# Patient Record
Sex: Male | Born: 1940
Health system: Southern US, Community
[De-identification: ages and names within clinical notes are randomized; demographics above are authoritative.]

## PROBLEM LIST (undated history)

## (undated) DIAGNOSIS — D638 Anemia in other chronic diseases classified elsewhere: Secondary | ICD-10-CM

## (undated) DIAGNOSIS — R3911 Hesitancy of micturition: Secondary | ICD-10-CM

## (undated) DIAGNOSIS — Z125 Encounter for screening for malignant neoplasm of prostate: Secondary | ICD-10-CM

## (undated) DIAGNOSIS — R5383 Other fatigue: Secondary | ICD-10-CM

## (undated) DIAGNOSIS — I209 Angina pectoris, unspecified: Secondary | ICD-10-CM

## (undated) DIAGNOSIS — M1990 Unspecified osteoarthritis, unspecified site: Secondary | ICD-10-CM

## (undated) DIAGNOSIS — R7303 Prediabetes: Secondary | ICD-10-CM

## (undated) DIAGNOSIS — I700 Atherosclerosis of aorta: Secondary | ICD-10-CM

## (undated) DIAGNOSIS — R5381 Other malaise: Secondary | ICD-10-CM

## (undated) DIAGNOSIS — I517 Cardiomegaly: Secondary | ICD-10-CM

## (undated) DIAGNOSIS — Z79899 Other long term (current) drug therapy: Secondary | ICD-10-CM

## (undated) DIAGNOSIS — I480 Paroxysmal atrial fibrillation: Secondary | ICD-10-CM

## (undated) DIAGNOSIS — R0902 Hypoxemia: Secondary | ICD-10-CM

## (undated) DIAGNOSIS — I2510 Atherosclerotic heart disease of native coronary artery without angina pectoris: Secondary | ICD-10-CM

## (undated) DIAGNOSIS — M5136 Other intervertebral disc degeneration, lumbar region: Secondary | ICD-10-CM

## (undated) DIAGNOSIS — R600 Localized edema: Secondary | ICD-10-CM

## (undated) DIAGNOSIS — E538 Deficiency of other specified B group vitamins: Secondary | ICD-10-CM

## (undated) DIAGNOSIS — I10 Essential (primary) hypertension: Secondary | ICD-10-CM

## (undated) DIAGNOSIS — N401 Enlarged prostate with lower urinary tract symptoms: Secondary | ICD-10-CM

## (undated) DIAGNOSIS — J8410 Pulmonary fibrosis, unspecified: Secondary | ICD-10-CM

## (undated) DIAGNOSIS — G4733 Obstructive sleep apnea (adult) (pediatric): Secondary | ICD-10-CM

## (undated) DIAGNOSIS — Z6836 Body mass index (BMI) 36.0-36.9, adult: Secondary | ICD-10-CM

## (undated) DIAGNOSIS — I639 Cerebral infarction, unspecified: Secondary | ICD-10-CM

## (undated) DIAGNOSIS — I5032 Chronic diastolic (congestive) heart failure: Secondary | ICD-10-CM

## (undated) DIAGNOSIS — E785 Hyperlipidemia, unspecified: Secondary | ICD-10-CM

## (undated) HISTORY — DX: Hesitancy of micturition: R39.11

## (undated) HISTORY — DX: Atherosclerotic heart disease of native coronary artery without angina pectoris: I25.10

## (undated) HISTORY — DX: Morbid (severe) obesity due to excess calories: E66.01

## (undated) HISTORY — PX: CATARACT EXTRACTION, BILATERAL: SHX1313

## (undated) HISTORY — PX: HERNIA REPAIR: SHX51

## (undated) HISTORY — DX: Cerebral infarction, unspecified: I63.9

## (undated) HISTORY — DX: Benign prostatic hyperplasia with lower urinary tract symptoms: N40.1

## (undated) HISTORY — DX: Other intervertebral disc degeneration, lumbar region: M51.36

## (undated) HISTORY — DX: Prediabetes: R73.03

## (undated) HISTORY — DX: Angina pectoris, unspecified: I20.9

## (undated) HISTORY — PX: CORONARY ANGIOPLASTY WITH STENT PLACEMENT: SHX49

## (undated) HISTORY — DX: Body mass index (bmi) 36.0-36.9, adult: Z68.36

## (undated) HISTORY — DX: Pulmonary fibrosis, unspecified: J84.10

## (undated) HISTORY — DX: Deficiency of other specified B group vitamins: E53.8

## (undated) HISTORY — DX: Unspecified osteoarthritis, unspecified site: M19.90

## (undated) HISTORY — DX: Other malaise: R53.81

## (undated) HISTORY — DX: Cardiomegaly: I51.7

## (undated) HISTORY — DX: Obstructive sleep apnea (adult) (pediatric): G47.33

## (undated) HISTORY — DX: Anemia in other chronic diseases classified elsewhere: D63.8

## (undated) HISTORY — DX: Chronic diastolic (congestive) heart failure: I50.32

## (undated) HISTORY — DX: Essential (primary) hypertension: I10

## (undated) HISTORY — DX: Paroxysmal atrial fibrillation: I48.0

## (undated) HISTORY — DX: Other fatigue: R53.83

## (undated) HISTORY — DX: Atherosclerosis of aorta: I70.0

## (undated) HISTORY — PX: APPENDECTOMY: SHX54

## (undated) HISTORY — DX: Hypoxemia: R09.02

## (undated) HISTORY — DX: Other long term (current) drug therapy: Z79.899

## (undated) HISTORY — DX: Hyperlipidemia, unspecified: E78.5

## (undated) HISTORY — DX: Localized edema: R60.0

## (undated) HISTORY — DX: Encounter for screening for malignant neoplasm of prostate: Z12.5

---

## 2015-02-03 DIAGNOSIS — I10 Essential (primary) hypertension: Secondary | ICD-10-CM | POA: Insufficient documentation

## 2015-02-03 DIAGNOSIS — E785 Hyperlipidemia, unspecified: Secondary | ICD-10-CM

## 2015-02-03 DIAGNOSIS — I2510 Atherosclerotic heart disease of native coronary artery without angina pectoris: Secondary | ICD-10-CM

## 2015-02-03 DIAGNOSIS — I110 Hypertensive heart disease with heart failure: Secondary | ICD-10-CM

## 2015-02-03 HISTORY — DX: Hypertensive heart disease with heart failure: I11.0

## 2015-02-03 HISTORY — DX: Atherosclerotic heart disease of native coronary artery without angina pectoris: I25.10

## 2015-02-03 HISTORY — DX: Essential (primary) hypertension: I10

## 2015-02-03 HISTORY — DX: Hyperlipidemia, unspecified: E78.5

## 2015-02-04 DIAGNOSIS — R079 Chest pain, unspecified: Secondary | ICD-10-CM

## 2015-02-04 DIAGNOSIS — I209 Angina pectoris, unspecified: Secondary | ICD-10-CM | POA: Insufficient documentation

## 2015-02-04 HISTORY — DX: Angina pectoris, unspecified: I20.9

## 2015-02-04 HISTORY — DX: Chest pain, unspecified: R07.9

## 2015-03-02 DIAGNOSIS — I480 Paroxysmal atrial fibrillation: Secondary | ICD-10-CM

## 2015-03-02 HISTORY — DX: Paroxysmal atrial fibrillation: I48.0

## 2015-09-14 DIAGNOSIS — J8410 Pulmonary fibrosis, unspecified: Secondary | ICD-10-CM | POA: Diagnosis not present

## 2015-09-14 DIAGNOSIS — R7303 Prediabetes: Secondary | ICD-10-CM | POA: Diagnosis not present

## 2015-09-14 DIAGNOSIS — M5137 Other intervertebral disc degeneration, lumbosacral region: Secondary | ICD-10-CM | POA: Diagnosis not present

## 2015-09-14 DIAGNOSIS — M5416 Radiculopathy, lumbar region: Secondary | ICD-10-CM | POA: Diagnosis not present

## 2015-09-14 DIAGNOSIS — I10 Essential (primary) hypertension: Secondary | ICD-10-CM | POA: Diagnosis not present

## 2015-10-11 DIAGNOSIS — M1990 Unspecified osteoarthritis, unspecified site: Secondary | ICD-10-CM

## 2015-10-11 DIAGNOSIS — R5381 Other malaise: Secondary | ICD-10-CM

## 2015-10-11 DIAGNOSIS — I10 Essential (primary) hypertension: Secondary | ICD-10-CM

## 2015-10-11 DIAGNOSIS — R0902 Hypoxemia: Secondary | ICD-10-CM

## 2015-10-11 DIAGNOSIS — R7303 Prediabetes: Secondary | ICD-10-CM

## 2015-10-11 DIAGNOSIS — R5383 Other fatigue: Secondary | ICD-10-CM

## 2015-10-11 DIAGNOSIS — J8410 Pulmonary fibrosis, unspecified: Secondary | ICD-10-CM

## 2015-10-11 DIAGNOSIS — I517 Cardiomegaly: Secondary | ICD-10-CM

## 2015-10-11 DIAGNOSIS — I700 Atherosclerosis of aorta: Secondary | ICD-10-CM

## 2015-10-11 DIAGNOSIS — I2510 Atherosclerotic heart disease of native coronary artery without angina pectoris: Secondary | ICD-10-CM | POA: Insufficient documentation

## 2015-10-11 HISTORY — DX: Cardiomegaly: I51.7

## 2015-10-11 HISTORY — DX: Unspecified osteoarthritis, unspecified site: M19.90

## 2015-10-11 HISTORY — DX: Hypoxemia: R09.02

## 2015-10-11 HISTORY — DX: Atherosclerotic heart disease of native coronary artery without angina pectoris: I25.10

## 2015-10-11 HISTORY — DX: Essential (primary) hypertension: I10

## 2015-10-11 HISTORY — DX: Other fatigue: R53.81

## 2015-10-11 HISTORY — DX: Atherosclerosis of aorta: I70.0

## 2015-10-11 HISTORY — DX: Pulmonary fibrosis, unspecified: J84.10

## 2015-10-11 HISTORY — DX: Prediabetes: R73.03

## 2015-10-15 DIAGNOSIS — Z79899 Other long term (current) drug therapy: Secondary | ICD-10-CM | POA: Insufficient documentation

## 2015-10-15 HISTORY — DX: Other long term (current) drug therapy: Z79.899

## 2015-10-18 DIAGNOSIS — Z125 Encounter for screening for malignant neoplasm of prostate: Secondary | ICD-10-CM | POA: Diagnosis not present

## 2015-10-18 DIAGNOSIS — I25118 Atherosclerotic heart disease of native coronary artery with other forms of angina pectoris: Secondary | ICD-10-CM | POA: Diagnosis not present

## 2015-10-18 DIAGNOSIS — I517 Cardiomegaly: Secondary | ICD-10-CM | POA: Diagnosis not present

## 2015-10-18 DIAGNOSIS — M150 Primary generalized (osteo)arthritis: Secondary | ICD-10-CM | POA: Diagnosis not present

## 2015-10-18 DIAGNOSIS — R5383 Other fatigue: Secondary | ICD-10-CM | POA: Diagnosis not present

## 2015-10-18 DIAGNOSIS — E66811 Obesity, class 1: Secondary | ICD-10-CM

## 2015-10-18 DIAGNOSIS — J8410 Pulmonary fibrosis, unspecified: Secondary | ICD-10-CM | POA: Diagnosis not present

## 2015-10-18 DIAGNOSIS — R5381 Other malaise: Secondary | ICD-10-CM | POA: Diagnosis not present

## 2015-10-18 DIAGNOSIS — E782 Mixed hyperlipidemia: Secondary | ICD-10-CM | POA: Diagnosis not present

## 2015-10-18 DIAGNOSIS — I209 Angina pectoris, unspecified: Secondary | ICD-10-CM | POA: Diagnosis not present

## 2015-10-18 DIAGNOSIS — Z23 Encounter for immunization: Secondary | ICD-10-CM | POA: Diagnosis not present

## 2015-10-18 DIAGNOSIS — I700 Atherosclerosis of aorta: Secondary | ICD-10-CM | POA: Diagnosis not present

## 2015-10-18 DIAGNOSIS — Z79899 Other long term (current) drug therapy: Secondary | ICD-10-CM | POA: Diagnosis not present

## 2015-10-18 DIAGNOSIS — R69 Illness, unspecified: Secondary | ICD-10-CM

## 2015-10-18 DIAGNOSIS — Z6836 Body mass index (BMI) 36.0-36.9, adult: Secondary | ICD-10-CM

## 2015-10-18 DIAGNOSIS — Z0000 Encounter for general adult medical examination without abnormal findings: Secondary | ICD-10-CM | POA: Diagnosis not present

## 2015-10-18 DIAGNOSIS — E66812 Obesity, class 2: Secondary | ICD-10-CM

## 2015-10-18 DIAGNOSIS — I10 Essential (primary) hypertension: Secondary | ICD-10-CM | POA: Diagnosis not present

## 2015-10-18 DIAGNOSIS — R7303 Prediabetes: Secondary | ICD-10-CM | POA: Diagnosis not present

## 2015-10-18 DIAGNOSIS — E669 Obesity, unspecified: Secondary | ICD-10-CM

## 2015-10-18 HISTORY — DX: Obesity, class 1: E66.811

## 2015-10-18 HISTORY — DX: Morbid (severe) obesity due to excess calories: E66.01

## 2015-10-18 HISTORY — DX: Illness, unspecified: R69

## 2015-10-18 HISTORY — DX: Obesity, unspecified: E66.9

## 2015-10-18 HISTORY — DX: Obesity, class 2: E66.812

## 2015-12-29 DIAGNOSIS — I2510 Atherosclerotic heart disease of native coronary artery without angina pectoris: Secondary | ICD-10-CM | POA: Diagnosis not present

## 2015-12-29 DIAGNOSIS — E785 Hyperlipidemia, unspecified: Secondary | ICD-10-CM | POA: Diagnosis not present

## 2015-12-29 DIAGNOSIS — I10 Essential (primary) hypertension: Secondary | ICD-10-CM | POA: Diagnosis not present

## 2015-12-29 DIAGNOSIS — I480 Paroxysmal atrial fibrillation: Secondary | ICD-10-CM | POA: Diagnosis not present

## 2016-02-16 DIAGNOSIS — E782 Mixed hyperlipidemia: Secondary | ICD-10-CM | POA: Diagnosis not present

## 2016-02-16 DIAGNOSIS — I10 Essential (primary) hypertension: Secondary | ICD-10-CM | POA: Diagnosis not present

## 2016-02-16 DIAGNOSIS — E876 Hypokalemia: Secondary | ICD-10-CM | POA: Diagnosis not present

## 2016-02-16 DIAGNOSIS — R7303 Prediabetes: Secondary | ICD-10-CM | POA: Diagnosis not present

## 2016-02-16 DIAGNOSIS — M150 Primary generalized (osteo)arthritis: Secondary | ICD-10-CM | POA: Diagnosis not present

## 2016-02-16 DIAGNOSIS — J8410 Pulmonary fibrosis, unspecified: Secondary | ICD-10-CM | POA: Diagnosis not present

## 2016-02-16 DIAGNOSIS — I700 Atherosclerosis of aorta: Secondary | ICD-10-CM | POA: Diagnosis not present

## 2016-02-25 DIAGNOSIS — E876 Hypokalemia: Secondary | ICD-10-CM | POA: Diagnosis not present

## 2016-03-27 DIAGNOSIS — I25118 Atherosclerotic heart disease of native coronary artery with other forms of angina pectoris: Secondary | ICD-10-CM | POA: Diagnosis not present

## 2016-03-27 DIAGNOSIS — Z01818 Encounter for other preprocedural examination: Secondary | ICD-10-CM | POA: Diagnosis not present

## 2016-03-27 DIAGNOSIS — J8410 Pulmonary fibrosis, unspecified: Secondary | ICD-10-CM | POA: Diagnosis not present

## 2016-03-27 DIAGNOSIS — R7303 Prediabetes: Secondary | ICD-10-CM | POA: Diagnosis not present

## 2016-03-27 DIAGNOSIS — I10 Essential (primary) hypertension: Secondary | ICD-10-CM | POA: Diagnosis not present

## 2016-03-27 DIAGNOSIS — E782 Mixed hyperlipidemia: Secondary | ICD-10-CM | POA: Diagnosis not present

## 2016-04-05 DIAGNOSIS — G4733 Obstructive sleep apnea (adult) (pediatric): Secondary | ICD-10-CM | POA: Diagnosis not present

## 2016-04-05 DIAGNOSIS — F411 Generalized anxiety disorder: Secondary | ICD-10-CM | POA: Diagnosis not present

## 2016-04-05 DIAGNOSIS — R5383 Other fatigue: Secondary | ICD-10-CM | POA: Diagnosis not present

## 2016-04-05 DIAGNOSIS — J4540 Moderate persistent asthma, uncomplicated: Secondary | ICD-10-CM | POA: Diagnosis not present

## 2016-04-05 DIAGNOSIS — J301 Allergic rhinitis due to pollen: Secondary | ICD-10-CM | POA: Diagnosis not present

## 2016-04-06 DIAGNOSIS — I480 Paroxysmal atrial fibrillation: Secondary | ICD-10-CM | POA: Diagnosis not present

## 2016-04-06 DIAGNOSIS — Z01810 Encounter for preprocedural cardiovascular examination: Secondary | ICD-10-CM | POA: Diagnosis not present

## 2016-04-17 DIAGNOSIS — M9973 Connective tissue and disc stenosis of intervertebral foramina of lumbar region: Secondary | ICD-10-CM | POA: Diagnosis not present

## 2016-04-21 DIAGNOSIS — I252 Old myocardial infarction: Secondary | ICD-10-CM | POA: Diagnosis not present

## 2016-04-21 DIAGNOSIS — I209 Angina pectoris, unspecified: Secondary | ICD-10-CM | POA: Diagnosis not present

## 2016-04-21 DIAGNOSIS — J45909 Unspecified asthma, uncomplicated: Secondary | ICD-10-CM | POA: Diagnosis not present

## 2016-04-21 DIAGNOSIS — I10 Essential (primary) hypertension: Secondary | ICD-10-CM | POA: Diagnosis not present

## 2016-04-21 DIAGNOSIS — Z4789 Encounter for other orthopedic aftercare: Secondary | ICD-10-CM | POA: Diagnosis not present

## 2016-04-21 DIAGNOSIS — G4730 Sleep apnea, unspecified: Secondary | ICD-10-CM | POA: Diagnosis not present

## 2016-04-21 DIAGNOSIS — I509 Heart failure, unspecified: Secondary | ICD-10-CM | POA: Diagnosis not present

## 2016-04-21 DIAGNOSIS — I25119 Atherosclerotic heart disease of native coronary artery with unspecified angina pectoris: Secondary | ICD-10-CM | POA: Diagnosis not present

## 2016-04-21 DIAGNOSIS — K219 Gastro-esophageal reflux disease without esophagitis: Secondary | ICD-10-CM | POA: Diagnosis not present

## 2016-04-21 DIAGNOSIS — K5900 Constipation, unspecified: Secondary | ICD-10-CM | POA: Diagnosis not present

## 2016-04-21 DIAGNOSIS — M1990 Unspecified osteoarthritis, unspecified site: Secondary | ICD-10-CM | POA: Diagnosis not present

## 2016-04-21 DIAGNOSIS — E7800 Pure hypercholesterolemia, unspecified: Secondary | ICD-10-CM | POA: Diagnosis not present

## 2016-05-19 DIAGNOSIS — I10 Essential (primary) hypertension: Secondary | ICD-10-CM | POA: Diagnosis not present

## 2016-05-19 DIAGNOSIS — N401 Enlarged prostate with lower urinary tract symptoms: Secondary | ICD-10-CM | POA: Diagnosis not present

## 2016-05-19 DIAGNOSIS — Z125 Encounter for screening for malignant neoplasm of prostate: Secondary | ICD-10-CM | POA: Diagnosis not present

## 2016-06-08 DIAGNOSIS — E669 Obesity, unspecified: Secondary | ICD-10-CM | POA: Diagnosis not present

## 2016-06-08 DIAGNOSIS — F418 Other specified anxiety disorders: Secondary | ICD-10-CM

## 2016-06-08 DIAGNOSIS — E785 Hyperlipidemia, unspecified: Secondary | ICD-10-CM | POA: Diagnosis not present

## 2016-06-08 DIAGNOSIS — I10 Essential (primary) hypertension: Secondary | ICD-10-CM | POA: Diagnosis not present

## 2016-06-08 DIAGNOSIS — G4733 Obstructive sleep apnea (adult) (pediatric): Secondary | ICD-10-CM

## 2016-06-08 DIAGNOSIS — I2510 Atherosclerotic heart disease of native coronary artery without angina pectoris: Secondary | ICD-10-CM | POA: Diagnosis not present

## 2016-06-08 DIAGNOSIS — N400 Enlarged prostate without lower urinary tract symptoms: Secondary | ICD-10-CM

## 2016-06-09 DIAGNOSIS — E785 Hyperlipidemia, unspecified: Secondary | ICD-10-CM | POA: Diagnosis not present

## 2016-06-09 DIAGNOSIS — F418 Other specified anxiety disorders: Secondary | ICD-10-CM | POA: Diagnosis not present

## 2016-06-09 DIAGNOSIS — N400 Enlarged prostate without lower urinary tract symptoms: Secondary | ICD-10-CM | POA: Diagnosis not present

## 2016-06-09 DIAGNOSIS — G4733 Obstructive sleep apnea (adult) (pediatric): Secondary | ICD-10-CM | POA: Diagnosis not present

## 2016-06-10 DIAGNOSIS — N400 Enlarged prostate without lower urinary tract symptoms: Secondary | ICD-10-CM

## 2016-06-10 DIAGNOSIS — G4733 Obstructive sleep apnea (adult) (pediatric): Secondary | ICD-10-CM | POA: Diagnosis not present

## 2016-06-10 DIAGNOSIS — F418 Other specified anxiety disorders: Secondary | ICD-10-CM | POA: Diagnosis not present

## 2016-06-10 DIAGNOSIS — E669 Obesity, unspecified: Secondary | ICD-10-CM | POA: Diagnosis not present

## 2016-06-10 DIAGNOSIS — E785 Hyperlipidemia, unspecified: Secondary | ICD-10-CM | POA: Diagnosis not present

## 2016-06-10 DIAGNOSIS — T814XXA Infection following a procedure, initial encounter: Secondary | ICD-10-CM | POA: Diagnosis not present

## 2016-06-11 DIAGNOSIS — N400 Enlarged prostate without lower urinary tract symptoms: Secondary | ICD-10-CM | POA: Diagnosis not present

## 2016-06-11 DIAGNOSIS — F418 Other specified anxiety disorders: Secondary | ICD-10-CM | POA: Diagnosis not present

## 2016-06-11 DIAGNOSIS — T814XXA Infection following a procedure, initial encounter: Secondary | ICD-10-CM | POA: Diagnosis not present

## 2016-06-11 DIAGNOSIS — E669 Obesity, unspecified: Secondary | ICD-10-CM | POA: Diagnosis not present

## 2016-06-12 DIAGNOSIS — E669 Obesity, unspecified: Secondary | ICD-10-CM | POA: Diagnosis not present

## 2016-06-12 DIAGNOSIS — N400 Enlarged prostate without lower urinary tract symptoms: Secondary | ICD-10-CM | POA: Diagnosis not present

## 2016-06-12 DIAGNOSIS — F418 Other specified anxiety disorders: Secondary | ICD-10-CM | POA: Diagnosis not present

## 2016-06-12 DIAGNOSIS — T814XXA Infection following a procedure, initial encounter: Secondary | ICD-10-CM | POA: Diagnosis not present

## 2016-06-13 DIAGNOSIS — F418 Other specified anxiety disorders: Secondary | ICD-10-CM | POA: Diagnosis not present

## 2016-06-13 DIAGNOSIS — N400 Enlarged prostate without lower urinary tract symptoms: Secondary | ICD-10-CM | POA: Diagnosis not present

## 2016-06-13 DIAGNOSIS — E785 Hyperlipidemia, unspecified: Secondary | ICD-10-CM | POA: Diagnosis not present

## 2016-06-13 DIAGNOSIS — G4733 Obstructive sleep apnea (adult) (pediatric): Secondary | ICD-10-CM | POA: Diagnosis not present

## 2016-06-14 DIAGNOSIS — F418 Other specified anxiety disorders: Secondary | ICD-10-CM | POA: Diagnosis not present

## 2016-06-14 DIAGNOSIS — G4733 Obstructive sleep apnea (adult) (pediatric): Secondary | ICD-10-CM | POA: Diagnosis not present

## 2016-06-14 DIAGNOSIS — E785 Hyperlipidemia, unspecified: Secondary | ICD-10-CM | POA: Diagnosis not present

## 2016-06-14 DIAGNOSIS — N400 Enlarged prostate without lower urinary tract symptoms: Secondary | ICD-10-CM | POA: Diagnosis not present

## 2016-08-17 DIAGNOSIS — R5383 Other fatigue: Secondary | ICD-10-CM | POA: Diagnosis not present

## 2016-08-17 DIAGNOSIS — E782 Mixed hyperlipidemia: Secondary | ICD-10-CM | POA: Diagnosis not present

## 2016-08-17 DIAGNOSIS — I25118 Atherosclerotic heart disease of native coronary artery with other forms of angina pectoris: Secondary | ICD-10-CM | POA: Diagnosis not present

## 2016-08-17 DIAGNOSIS — N401 Enlarged prostate with lower urinary tract symptoms: Secondary | ICD-10-CM

## 2016-08-17 DIAGNOSIS — R300 Dysuria: Secondary | ICD-10-CM | POA: Diagnosis not present

## 2016-08-17 DIAGNOSIS — M5136 Other intervertebral disc degeneration, lumbar region: Secondary | ICD-10-CM

## 2016-08-17 DIAGNOSIS — N400 Enlarged prostate without lower urinary tract symptoms: Secondary | ICD-10-CM

## 2016-08-17 DIAGNOSIS — M51369 Other intervertebral disc degeneration, lumbar region without mention of lumbar back pain or lower extremity pain: Secondary | ICD-10-CM

## 2016-08-17 DIAGNOSIS — E669 Obesity, unspecified: Secondary | ICD-10-CM | POA: Diagnosis not present

## 2016-08-17 DIAGNOSIS — Z79899 Other long term (current) drug therapy: Secondary | ICD-10-CM | POA: Diagnosis not present

## 2016-08-17 DIAGNOSIS — R7303 Prediabetes: Secondary | ICD-10-CM | POA: Diagnosis not present

## 2016-08-17 DIAGNOSIS — R3911 Hesitancy of micturition: Secondary | ICD-10-CM | POA: Diagnosis not present

## 2016-08-17 DIAGNOSIS — I10 Essential (primary) hypertension: Secondary | ICD-10-CM | POA: Diagnosis not present

## 2016-08-17 DIAGNOSIS — R419 Unspecified symptoms and signs involving cognitive functions and awareness: Secondary | ICD-10-CM | POA: Diagnosis not present

## 2016-08-17 DIAGNOSIS — R5381 Other malaise: Secondary | ICD-10-CM | POA: Diagnosis not present

## 2016-08-17 HISTORY — DX: Benign prostatic hyperplasia with lower urinary tract symptoms: N40.1

## 2016-08-17 HISTORY — DX: Other intervertebral disc degeneration, lumbar region: M51.36

## 2016-08-17 HISTORY — DX: Benign prostatic hyperplasia without lower urinary tract symptoms: N40.0

## 2016-08-17 HISTORY — DX: Other intervertebral disc degeneration, lumbar region without mention of lumbar back pain or lower extremity pain: M51.369

## 2016-10-03 DIAGNOSIS — D649 Anemia, unspecified: Secondary | ICD-10-CM | POA: Diagnosis not present

## 2016-10-03 DIAGNOSIS — M5136 Other intervertebral disc degeneration, lumbar region: Secondary | ICD-10-CM | POA: Diagnosis not present

## 2016-10-03 DIAGNOSIS — Z79899 Other long term (current) drug therapy: Secondary | ICD-10-CM | POA: Diagnosis not present

## 2016-10-03 DIAGNOSIS — D638 Anemia in other chronic diseases classified elsewhere: Secondary | ICD-10-CM

## 2016-10-03 DIAGNOSIS — I25118 Atherosclerotic heart disease of native coronary artery with other forms of angina pectoris: Secondary | ICD-10-CM | POA: Diagnosis not present

## 2016-10-03 DIAGNOSIS — I10 Essential (primary) hypertension: Secondary | ICD-10-CM | POA: Diagnosis not present

## 2016-10-03 DIAGNOSIS — Z712 Person consulting for explanation of examination or test findings: Secondary | ICD-10-CM | POA: Diagnosis not present

## 2016-10-03 DIAGNOSIS — E538 Deficiency of other specified B group vitamins: Secondary | ICD-10-CM

## 2016-10-03 HISTORY — DX: Anemia in other chronic diseases classified elsewhere: D63.8

## 2016-10-03 HISTORY — DX: Deficiency of other specified B group vitamins: E53.8

## 2016-11-14 DIAGNOSIS — Z79899 Other long term (current) drug therapy: Secondary | ICD-10-CM | POA: Diagnosis not present

## 2016-11-14 DIAGNOSIS — E538 Deficiency of other specified B group vitamins: Secondary | ICD-10-CM | POA: Diagnosis not present

## 2016-11-14 DIAGNOSIS — R600 Localized edema: Secondary | ICD-10-CM

## 2016-11-14 DIAGNOSIS — D649 Anemia, unspecified: Secondary | ICD-10-CM | POA: Diagnosis not present

## 2016-11-14 DIAGNOSIS — I25118 Atherosclerotic heart disease of native coronary artery with other forms of angina pectoris: Secondary | ICD-10-CM | POA: Diagnosis not present

## 2016-11-14 HISTORY — DX: Localized edema: R60.0

## 2017-07-10 DIAGNOSIS — K449 Diaphragmatic hernia without obstruction or gangrene: Secondary | ICD-10-CM | POA: Diagnosis not present

## 2017-07-10 DIAGNOSIS — R5381 Other malaise: Secondary | ICD-10-CM | POA: Diagnosis not present

## 2017-07-10 DIAGNOSIS — R1032 Left lower quadrant pain: Secondary | ICD-10-CM | POA: Diagnosis not present

## 2017-07-10 DIAGNOSIS — I10 Essential (primary) hypertension: Secondary | ICD-10-CM | POA: Diagnosis not present

## 2017-07-10 DIAGNOSIS — R7303 Prediabetes: Secondary | ICD-10-CM | POA: Diagnosis not present

## 2017-10-05 DIAGNOSIS — I10 Essential (primary) hypertension: Secondary | ICD-10-CM | POA: Diagnosis not present

## 2017-10-05 DIAGNOSIS — D649 Anemia, unspecified: Secondary | ICD-10-CM | POA: Diagnosis not present

## 2017-10-05 DIAGNOSIS — E538 Deficiency of other specified B group vitamins: Secondary | ICD-10-CM | POA: Diagnosis not present

## 2017-10-05 DIAGNOSIS — I25118 Atherosclerotic heart disease of native coronary artery with other forms of angina pectoris: Secondary | ICD-10-CM | POA: Diagnosis not present

## 2017-10-05 DIAGNOSIS — R7303 Prediabetes: Secondary | ICD-10-CM | POA: Diagnosis not present

## 2017-10-05 DIAGNOSIS — E782 Mixed hyperlipidemia: Secondary | ICD-10-CM | POA: Diagnosis not present

## 2017-10-05 DIAGNOSIS — J8410 Pulmonary fibrosis, unspecified: Secondary | ICD-10-CM | POA: Diagnosis not present

## 2018-04-10 DIAGNOSIS — I25118 Atherosclerotic heart disease of native coronary artery with other forms of angina pectoris: Secondary | ICD-10-CM | POA: Diagnosis not present

## 2018-04-10 DIAGNOSIS — M150 Primary generalized (osteo)arthritis: Secondary | ICD-10-CM | POA: Diagnosis not present

## 2018-04-10 DIAGNOSIS — R5383 Other fatigue: Secondary | ICD-10-CM | POA: Diagnosis not present

## 2018-04-10 DIAGNOSIS — Z125 Encounter for screening for malignant neoplasm of prostate: Secondary | ICD-10-CM | POA: Insufficient documentation

## 2018-04-10 DIAGNOSIS — E538 Deficiency of other specified B group vitamins: Secondary | ICD-10-CM | POA: Diagnosis not present

## 2018-04-10 DIAGNOSIS — D638 Anemia in other chronic diseases classified elsewhere: Secondary | ICD-10-CM | POA: Diagnosis not present

## 2018-04-10 DIAGNOSIS — G4733 Obstructive sleep apnea (adult) (pediatric): Secondary | ICD-10-CM

## 2018-04-10 DIAGNOSIS — E782 Mixed hyperlipidemia: Secondary | ICD-10-CM | POA: Diagnosis not present

## 2018-04-10 DIAGNOSIS — R5381 Other malaise: Secondary | ICD-10-CM | POA: Diagnosis not present

## 2018-04-10 DIAGNOSIS — N401 Enlarged prostate with lower urinary tract symptoms: Secondary | ICD-10-CM | POA: Diagnosis not present

## 2018-04-10 DIAGNOSIS — Z0000 Encounter for general adult medical examination without abnormal findings: Secondary | ICD-10-CM | POA: Diagnosis not present

## 2018-04-10 DIAGNOSIS — I700 Atherosclerosis of aorta: Secondary | ICD-10-CM | POA: Diagnosis not present

## 2018-04-10 DIAGNOSIS — R7303 Prediabetes: Secondary | ICD-10-CM | POA: Diagnosis not present

## 2018-04-10 DIAGNOSIS — R3911 Hesitancy of micturition: Secondary | ICD-10-CM | POA: Diagnosis not present

## 2018-04-10 DIAGNOSIS — I10 Essential (primary) hypertension: Secondary | ICD-10-CM | POA: Diagnosis not present

## 2018-04-10 DIAGNOSIS — J8410 Pulmonary fibrosis, unspecified: Secondary | ICD-10-CM | POA: Diagnosis not present

## 2018-04-10 DIAGNOSIS — M5136 Other intervertebral disc degeneration, lumbar region: Secondary | ICD-10-CM | POA: Diagnosis not present

## 2018-04-10 HISTORY — DX: Encounter for screening for malignant neoplasm of prostate: Z12.5

## 2018-04-10 HISTORY — DX: Obstructive sleep apnea (adult) (pediatric): G47.33

## 2018-04-16 DIAGNOSIS — I5032 Chronic diastolic (congestive) heart failure: Secondary | ICD-10-CM

## 2018-04-16 HISTORY — DX: Chronic diastolic (congestive) heart failure: I50.32

## 2018-04-17 ENCOUNTER — Encounter: Payer: Self-pay | Admitting: Cardiology

## 2018-04-17 ENCOUNTER — Ambulatory Visit (INDEPENDENT_AMBULATORY_CARE_PROVIDER_SITE_OTHER): Payer: PPO | Admitting: Cardiology

## 2018-04-17 VITALS — BP 138/68 | HR 51 | Ht 70.0 in | Wt 253.0 lb

## 2018-04-17 DIAGNOSIS — I10 Essential (primary) hypertension: Secondary | ICD-10-CM | POA: Diagnosis not present

## 2018-04-17 DIAGNOSIS — I480 Paroxysmal atrial fibrillation: Secondary | ICD-10-CM

## 2018-04-17 DIAGNOSIS — I5032 Chronic diastolic (congestive) heart failure: Secondary | ICD-10-CM

## 2018-04-17 DIAGNOSIS — E785 Hyperlipidemia, unspecified: Secondary | ICD-10-CM

## 2018-04-17 DIAGNOSIS — I25118 Atherosclerotic heart disease of native coronary artery with other forms of angina pectoris: Secondary | ICD-10-CM | POA: Diagnosis not present

## 2018-04-17 MED ORDER — FUROSEMIDE 40 MG PO TABS: 40.0000 mg | ORAL_TABLET | Freq: Two times a day (BID) | ORAL | 3 refills | Status: DC

## 2018-04-17 NOTE — Progress Notes (Signed)
Cardiology Office Note:    Date:  04/17/2018   ID:  Jimmy Chapman, DOB 1941/05/08, MRN [id number]  PCP:  Gordan Payment., MD  Cardiologist:  Norman Herrlich, MD    Referring MD: Gordan Payment., MD    ASSESSMENT:    1. Chronic diastolic heart failure (HCC)   2. Coronary artery disease of native artery of native heart with stable angina pectoris (HCC)   3. Benign essential HTN   4. Hyperlipidemia, unspecified hyperlipidemia type   5. Paroxysmal atrial fibrillation (HCC)    PLAN:    In order of problems listed above:  1. His heart failure is decompensated New York Heart Association class II to class III and is volume overload with marked peripheral edema he will increase his loop diuretic 50% weigh daily and asked him to contact me if he does not have a 10 pound weight loss in the next weeks.  He will fully sodium restrict and start to weigh daily. 2. Stable continue current medical treatment I would not advise an ischemia evaluation at this time 3. Stable blood pressure target continue current treatment including calcium channel blocker ACE inhibitor and loop diuretic 4. Stable continue with statin his lipids are at target 5. Stable he said no recurrence at this time I would not anticoagulate.  He is on a beta-blocker will continue the same.   Next appointment: 6 to 8 weeks   Medication Adjustments/Labs and Tests Ordered: Current medicines are reviewed at length with the patient today.  Concerns regarding medicines are outlined above.  Orders Placed This Encounter  Procedures  . Basic metabolic panel  . Pro b natriuretic peptide (BNP)  . EKG 12-Lead   Meds ordered this encounter  Medications  . furosemide (LASIX) 40 MG tablet    Sig: Take 1 tablet (40 mg total) by mouth 2 (two) times daily. Take 2 =80 mg in the AM and 1 =40 mg in the afternoon    Dispense:  90 tablet    Refill:  3    Chief Complaint  Patient presents with  . Follow-up    History of Present Illness:     Jimmy Chapman is a 77 y.o. male with a hx of CAD with multivessel PCI November 2014 proximal LAD left circumflex and right coronary artery EF 45%.  Subsequently 02/03/2015 PCI and drug-eluting stent to left circumflex and balloon angioplasty ostial right coronary artery for unstable angina EF 60%.  Other problems have included diastolic heart failure brief isolated paroxysmal atrial fibrillation during PCI  hypertension and hyperlipidemia last seen 12/28/16. Compliance with diet, lifestyle and medications: Not fully he is eating meals outside of the home and does not weigh daily although he does restrict sodium in addition to his diet.  He is noticed increasing shortness of breath from his baseline as well as edema and unspecified weight gain.  He is a CPAP and has had no orthopnea no angina palpitations syncope or TIA Past Medical History:  Diagnosis Date  . Anemia, chronic disease 10/03/2016  . Angina pectoris (HCC) 02/04/2015  . Aortic calcification (HCC) 10/11/2015  . Benign essential HTN 02/03/2015  . Benign prostatic hyperplasia with urinary hesitancy 08/17/2016  . Chronic diastolic heart failure (HCC) 04/16/2018  . Class 2 severe obesity due to excess calories with serious comorbidity and body mass index (BMI) of 36.0 to 36.9 in adult (HCC) 10/18/2015  . Coronary disease 10/11/2015   1. Multivessel PCI in Nov 2014 with 3 Xience stents to proximal LAD,  LCF and RCA, EF 45% 2. 02/03/15 with PCI and resolute stennt LCF and PTCA of ostial RCA stenosis, EF 60% for unstable angina  . Degenerative lumbar disc 08/17/2016  . High risk medication use 10/15/2015  . Hyperlipidemia 02/03/2015  . Hypoxia 10/11/2015  . LAE (left atrial enlargement) 10/11/2015  . Left ventricular hypertrophy 10/11/2015  . Localized edema 11/14/2016  . Malaise and fatigue 10/11/2015  . Obstructive sleep apnea syndrome 04/10/2018   Wears CPAP  . Osteoarthritis 10/11/2015  . Paroxysmal atrial fibrillation (HCC) 03/02/2015   Brief during  cardiac cath, not anticoagulated  . Prediabetes 10/11/2015  . Pulmonary fibrosis (HCC) 10/11/2015  . Screening for prostate cancer 04/10/2018   Chooses no further testing.  . Vitamin B12 deficiency 10/03/2016    Past Surgical History:  Procedure Laterality Date  . APPENDECTOMY    . CORONARY ANGIOPLASTY WITH STENT PLACEMENT    . HERNIA REPAIR      Current Medications: Current Meds  Medication Sig  . albuterol (PROVENTIL HFA) 108 (90 Base) MCG/ACT inhaler Inhale 2 puffs into the lungs every 6 (six) hours as needed for wheezing or shortness of breath.   . ALPRAZolam (XANAX) 0.5 MG tablet Take 0.5 mg by mouth at bedtime as needed for anxiety.  Marland Kitchen amLODipine (NORVASC) 10 MG tablet Take 5 mg by mouth daily.  . Ascorbic Acid (VITAMIN C) 1000 MG tablet Take 1,000 mg by mouth 2 (two) times daily.  Marland Kitchen aspirin EC 81 MG tablet Take 81 mg by mouth daily.  Marland Kitchen atorvastatin (LIPITOR) 40 MG tablet Take 40 mg by mouth daily.  . benazepril (LOTENSIN) 40 MG tablet Take 40 mg by mouth daily.  . Carboxymethylcellulose Sod PF (REFRESH PLUS) 0.5 % SOLN Administer 2 drops to both eyes daily at 0600.  Marland Kitchen Cholecalciferol (VITAMIN D PO) Take 1 capsule by mouth daily.  . clopidogrel (PLAVIX) 75 MG tablet Take 75 mg by mouth daily.  . finasteride (PROSCAR) 5 MG tablet Take 5 mg by mouth daily.  . fluticasone (FLONASE) 50 MCG/ACT nasal spray 2 sprays by Each Nare route Two (2) times a day.  . furosemide (LASIX) 40 MG tablet Take 1 tablet (40 mg total) by mouth 2 (two) times daily. Take 2 =80 mg in the AM and 1 =40 mg in the afternoon  . gabapentin (NEURONTIN) 300 MG capsule Take 300 mg by mouth every other day.  . metoprolol tartrate (LOPRESSOR) 25 MG tablet Take 25 mg by mouth 2 (two) times daily.  . Mometasone Furoate (ASMANEX HFA IN) Inhale 2 puffs into the lungs 2 (two) times daily.  . nitroGLYCERIN (NITROSTAT) 0.4 MG SL tablet Place 0.4 mg under the tongue every 5 (five) minutes as needed for chest pain.  .  pantoprazole (PROTONIX) 40 MG tablet Take 40 mg by mouth daily.  . Potassium Chloride ER 20 MEQ TBCR Take 1 tablet by mouth 2 (two) times daily.  . Probiotic Product (PROBIOTIC DAILY PO) Take 1 capsule by mouth daily.  . tamsulosin (FLOMAX) 0.4 MG CAPS capsule Take 0.4 mg by mouth daily.  . [DISCONTINUED] furosemide (LASIX) 40 MG tablet Take 40 mg by mouth 2 (two) times daily.     Allergies:   Patient has no known allergies.   Social History   Socioeconomic History  . Marital status: Married    Spouse name: Not on file  . Number of children: Not on file  . Years of education: Not on file  . Highest education level: Not on file  Occupational History  . Not on file  Social Needs  . Financial resource strain: Not on file  . Food insecurity:    Worry: Not on file    Inability: Not on file  . Transportation needs:    Medical: Not on file    Non-medical: Not on file  Tobacco Use  . Smoking status: Former Games developermoker  . Smokeless tobacco: Former Engineer, waterUser  Substance and Sexual Activity  . Alcohol use: Yes  . Drug use: Not Currently  . Sexual activity: Not on file  Lifestyle  . Physical activity:    Days per week: Not on file    Minutes per session: Not on file  . Stress: Not on file  Relationships  . Social connections:    Talks on phone: Not on file    Gets together: Not on file    Attends religious service: Not on file    Active member of club or organization: Not on file    Attends meetings of clubs or organizations: Not on file    Relationship status: Not on file  Other Topics Concern  . Not on file  Social History Narrative  . Not on file     Family History: The patient's family history includes Heart attack in his father; Hypertension in his mother. ROS:   Please see the history of present illness.    All other systems reviewed and are negative.  EKGs/Labs/Other Studies Reviewed:    The following studies were reviewed today:  EKG:  EKG ordered today.  The ekg  ordered today demonstrates sinus bradycardia 51 bpm old anterior septal MI  Recent Labs: No results found for requested labs within last 8760 hours.  Recent Lipid Panel No results found for: CHOL, TRIG, HDL, CHOLHDL, VLDL, LDLCALC, LDLDIRECT  Physical Exam:    VS:  BP 138/68 (BP Location: Right Arm, Patient Position: Sitting, Cuff Size: Large)   Pulse (!) 51   Ht 5\' 10"  (1.778 m)   Wt 253 lb (114.8 kg)   SpO2 96%   BMI 36.30 kg/m     Wt Readings from Last 3 Encounters:  04/17/18 253 lb (114.8 kg)     GEN:  Well nourished, well developed in no acute distress HEENT: Normal NECK: No JVD; No carotid bruits LYMPHATICS: No lymphadenopathy CARDIAC: 3+ to the knee bilateral RRR, no murmurs, rubs, gallops RESPIRATORY:  Clear to auscultation without rales, wheezing or rhonchi  ABDOMEN: Soft, non-tender, non-distended MUSCULOSKELETAL:  No edema; No deformity  SKIN: Warm and dry NEUROLOGIC:  Alert and oriented x 3 PSYCHIATRIC:  Normal affect    Signed, Norman HerrlichBrian Lucile Didonato, MD  04/17/2018 4:58 PM    [facility]

## 2018-04-17 NOTE — Patient Instructions (Signed)
Medication Instructions:  Your physician has recommended you make the following change in your medication:   INCREASE: Furosemide to 80 mg in the morning and 40 mg in the evening daily.   Labwork: Your physician recommends that you return for lab work in 2 weeks: BMP and Pro BNP   Testing/Procedures: None.  Follow-Up: Your physician recommends that you schedule a follow-up appointment in: 2 months.    Any Other Special Instructions Will Be Listed Below (If Applicable).     If you need a refill on your cardiac medications before your next appointment, please call your pharmacy.

## 2018-05-02 ENCOUNTER — Telehealth: Payer: Self-pay | Admitting: Emergency Medicine

## 2018-05-02 NOTE — Telephone Encounter (Signed)
Attempted to contact patient regarding labs that need to be drawn today. Unable to leave a voicemail and no answer for other's on dpr. Will continue efforts.

## 2018-05-07 DIAGNOSIS — I5032 Chronic diastolic (congestive) heart failure: Secondary | ICD-10-CM | POA: Diagnosis not present

## 2018-05-08 ENCOUNTER — Telehealth: Payer: Self-pay

## 2018-05-08 LAB — BASIC METABOLIC PANEL
BUN / CREAT RATIO: 16 (ref 10–24)
BUN: 16 mg/dL (ref 8–27)
CO2: 26 mmol/L (ref 20–29)
CREATININE: 0.98 mg/dL (ref 0.76–1.27)
Calcium: 9.2 mg/dL (ref 8.6–10.2)
Chloride: 100 mmol/L (ref 96–106)
GFR calc Af Amer: 87 mL/min/{1.73_m2} (ref >59)
GFR calc non Af Amer: 75 mL/min/{1.73_m2} (ref >59)
GLUCOSE: 95 mg/dL (ref 65–99)
Potassium: 3.7 mmol/L (ref 3.5–5.2)
Sodium: 144 mmol/L (ref 134–144)

## 2018-05-08 LAB — PRO B NATRIURETIC PEPTIDE: NT-PRO BNP: 46 pg/mL (ref 0–486)

## 2018-05-08 NOTE — Telephone Encounter (Signed)
Attempted to reach patient regarding lab results, but there was no answer and voicemail has not been set up.  Will continue efforts.

## 2018-05-09 NOTE — Telephone Encounter (Signed)
Hayley informed patient of results.

## 2018-07-01 NOTE — Progress Notes (Signed)
Cardiology Office Note:    Date:  07/02/2018   ID:  Jimmy Chapman, DOB Jan 07, 1941, MRN [id number]  PCP:  Gordan Payment., MD  Cardiologist:  Norman Herrlich, MD    Referring MD: Gordan Payment., MD    ASSESSMENT:    1. Coronary artery disease of native artery of native heart with stable angina pectoris (HCC)   2. Chronic diastolic heart failure (HCC)   3. Benign essential HTN   4. Paroxysmal atrial fibrillation (HCC)   5. Hyperlipidemia, unspecified hyperlipidemia type    PLAN:    In order of problems listed above:  1. Stable CAD has not had no anginal discomfort New York Heart Association class I and will continue current treatment with clopidogrel beta-blocker is high intensity statin 2. Improved he has no fluid overload near Heart Association class I continue his current higher dose loop diuretic and asked him to minimize his use of gabapentin which causes intense sodium retention 3. Stable blood pressure target continue current treatment eluding beta-blocker diuretic ACE inhibitor his knees had recent labs in the [facility] and asked him to bring them to my office to assess renal function potassium 4. Stable no recurrence continue beta-blocker clopidogrel I would not commit anticoagulation 5. Stable continue high intensity statin with CAD and hopefully bring labs to my office to see if he achieves target and liver function is normal   Next appointment: 6 months   Medication Adjustments/Labs and Tests Ordered: Current medicines are reviewed at length with the patient today.  Concerns regarding medicines are outlined above.  Orders Placed This Encounter  Procedures  . EKG 12-Lead   No orders of the defined types were placed in this encounter.   Chief Complaint  Patient presents with  . Follow-up  . Coronary Artery Disease  . Congestive Heart Failure  . Atrial Fibrillation  . Hypertension  . Hyperlipidemia    History of Present Illness:     Jimmy Chapman is a 77  y.o. male with a hx of CAD with multivessel PCI November 2014 proximal LAD left circumflex and right coronary artery EF 45%.  Subsequently 02/03/2015 PCI and drug-eluting stent to left circumflex and balloon angioplasty ostial right coronary artery for unstable angina EF 60%.  Other problems have included diastolic heart failure brief isolated paroxysmal atrial fibrillation during PCI  hypertension and hyperlipidemia  last seen 04/27/18. Compliance with diet, lifestyle and medications: Yes  He is doing better since increasing his diuretic and he has been reducing his gabapentin because of sodium retention navigation supervised weight loss program at the [facility].  Lost a total of 10 pounds he will bring recent labs to my office. Past Medical History:  Diagnosis Date  . Anemia, chronic disease 10/03/2016  . Angina pectoris (HCC) 02/04/2015  . Aortic calcification (HCC) 10/11/2015  . Benign essential HTN 02/03/2015  . Benign prostatic hyperplasia with urinary hesitancy 08/17/2016  . Chronic diastolic heart failure (HCC) 04/16/2018  . Class 2 severe obesity due to excess calories with serious comorbidity and body mass index (BMI) of 36.0 to 36.9 in adult (HCC) 10/18/2015  . Coronary disease 10/11/2015   1. Multivessel PCI in Nov 2014 with 3 Xience stents to proximal LAD, LCF and RCA, EF 45% 2. 02/03/15 with PCI and resolute stennt LCF and PTCA of ostial RCA stenosis, EF 60% for unstable angina  . Degenerative lumbar disc 08/17/2016  . High risk medication use 10/15/2015  . Hyperlipidemia 02/03/2015  . Hypoxia 10/11/2015  . LAE (  left atrial enlargement) 10/11/2015  . Left ventricular hypertrophy 10/11/2015  . Localized edema 11/14/2016  . Malaise and fatigue 10/11/2015  . Obstructive sleep apnea syndrome 04/10/2018   Wears CPAP  . Osteoarthritis 10/11/2015  . Paroxysmal atrial fibrillation (HCC) 03/02/2015   Brief during cardiac cath, not anticoagulated  . Prediabetes 10/11/2015  . Pulmonary fibrosis (HCC)  10/11/2015  . Screening for prostate cancer 04/10/2018   Chooses no further testing.  . Vitamin B12 deficiency 10/03/2016    Past Surgical History:  Procedure Laterality Date  . APPENDECTOMY    . CORONARY ANGIOPLASTY WITH STENT PLACEMENT    . HERNIA REPAIR      Current Medications: Current Meds  Medication Sig  . albuterol (PROVENTIL HFA) 108 (90 Base) MCG/ACT inhaler Inhale 2 puffs into the lungs every 6 (six) hours as needed for wheezing or shortness of breath.   . ALPRAZolam (XANAX) 0.5 MG tablet Take 0.5 mg by mouth at bedtime as needed for anxiety.  Marland Kitchen amLODipine (NORVASC) 10 MG tablet Take 5 mg by mouth daily.  . Ascorbic Acid (VITAMIN C) 1000 MG tablet Take 1,000 mg by mouth 2 (two) times daily.  Marland Kitchen aspirin EC 81 MG tablet Take 81 mg by mouth daily.  Marland Kitchen atorvastatin (LIPITOR) 40 MG tablet Take 40 mg by mouth daily.  . benazepril (LOTENSIN) 40 MG tablet Take 40 mg by mouth daily.  . Carboxymethylcellulose Sod PF (REFRESH PLUS) 0.5 % SOLN Administer 2 drops to both eyes daily at 0600.  Marland Kitchen Cholecalciferol (VITAMIN D PO) Take 1 capsule by mouth daily.  . clopidogrel (PLAVIX) 75 MG tablet Take 75 mg by mouth daily.  . finasteride (PROSCAR) 5 MG tablet Take 5 mg by mouth daily.  . fluticasone (FLONASE) 50 MCG/ACT nasal spray 2 sprays by Each Nare route Two (2) times a day.  . furosemide (LASIX) 40 MG tablet Take 1 tablet (40 mg total) by mouth 2 (two) times daily. Take 2 =80 mg in the AM and 1 =40 mg in the afternoon  . gabapentin (NEURONTIN) 300 MG capsule Take 300 mg by mouth daily.   . metoprolol tartrate (LOPRESSOR) 25 MG tablet Take 25 mg by mouth 2 (two) times daily.  . Mometasone Furoate (ASMANEX HFA IN) Inhale 2 puffs into the lungs 2 (two) times daily.  . nitroGLYCERIN (NITROSTAT) 0.4 MG SL tablet Place 0.4 mg under the tongue every 5 (five) minutes as needed for chest pain.  . pantoprazole (PROTONIX) 40 MG tablet Take 40 mg by mouth daily.  . Potassium Chloride ER 20 MEQ TBCR  Take 1 tablet by mouth 2 (two) times daily.  . Probiotic Product (PROBIOTIC DAILY PO) Take 1 capsule by mouth daily.  . tamsulosin (FLOMAX) 0.4 MG CAPS capsule Take 0.4 mg by mouth daily.     Allergies:   Patient has no known allergies.   Social History   Socioeconomic History  . Marital status: Married    Spouse name: Not on file  . Number of children: Not on file  . Years of education: Not on file  . Highest education level: Not on file  Occupational History  . Not on file  Social Needs  . Financial resource strain: Not on file  . Food insecurity:    Worry: Not on file    Inability: Not on file  . Transportation needs:    Medical: Not on file    Non-medical: Not on file  Tobacco Use  . Smoking status: Former Games developer  . Smokeless  tobacco: Former Engineer, water and Sexual Activity  . Alcohol use: Yes  . Drug use: Not Currently  . Sexual activity: Not on file  Lifestyle  . Physical activity:    Days per week: Not on file    Minutes per session: Not on file  . Stress: Not on file  Relationships  . Social connections:    Talks on phone: Not on file    Gets together: Not on file    Attends religious service: Not on file    Active member of club or organization: Not on file    Attends meetings of clubs or organizations: Not on file    Relationship status: Not on file  Other Topics Concern  . Not on file  Social History Narrative  . Not on file     Family History: The patient's family history includes Heart attack in his father; Hypertension in his mother. ROS:   Please see the history of present illness.    All other systems reviewed and are negative.  EKGs/Labs/Other Studies Reviewed:    The following studies were reviewed today:  Recent Labs: 05/07/2018: BUN 16; Creatinine, Ser 0.98; NT-Pro BNP 46; Potassium 3.7; Sodium 144  Recent Lipid Panel No results found for: CHOL, TRIG, HDL, CHOLHDL, VLDL, LDLCALC, LDLDIRECT  Physical Exam:    VS:  BP 132/64 (BP  Location: Right Arm, Patient Position: Sitting, Cuff Size: Large)   Pulse (!) 50   Ht 5\' 10"  (1.778 m)   Wt 243 lb 3.2 oz (110.3 kg)   SpO2 97%   BMI 34.90 kg/m     Wt Readings from Last 3 Encounters:  07/02/18 243 lb 3.2 oz (110.3 kg)  04/17/18 253 lb (114.8 kg)     GEN:  Well nourished, well developed in no acute distress HEENT: Normal NECK: No JVD; No carotid bruits LYMPHATICS: No lymphadenopathy CARDIAC: RRR, no murmurs, rubs, gallops RESPIRATORY:  Clear to auscultation without rales, wheezing or rhonchi  ABDOMEN: Soft, non-tender, non-distended MUSCULOSKELETAL:  No edema; No deformity  SKIN: Warm and dry NEUROLOGIC:  Alert and oriented x 3 PSYCHIATRIC:  Normal affect    Signed, Norman Herrlich, MD  07/02/2018 2:10 PM    [facility]

## 2018-07-02 ENCOUNTER — Encounter: Payer: Self-pay | Admitting: Cardiology

## 2018-07-02 ENCOUNTER — Ambulatory Visit (INDEPENDENT_AMBULATORY_CARE_PROVIDER_SITE_OTHER): Payer: PPO | Admitting: Cardiology

## 2018-07-02 VITALS — BP 132/64 | HR 50 | Ht 70.0 in | Wt 243.2 lb

## 2018-07-02 DIAGNOSIS — E785 Hyperlipidemia, unspecified: Secondary | ICD-10-CM

## 2018-07-02 DIAGNOSIS — I25118 Atherosclerotic heart disease of native coronary artery with other forms of angina pectoris: Secondary | ICD-10-CM | POA: Diagnosis not present

## 2018-07-02 DIAGNOSIS — I5032 Chronic diastolic (congestive) heart failure: Secondary | ICD-10-CM

## 2018-07-02 DIAGNOSIS — I10 Essential (primary) hypertension: Secondary | ICD-10-CM | POA: Diagnosis not present

## 2018-07-02 DIAGNOSIS — I480 Paroxysmal atrial fibrillation: Secondary | ICD-10-CM | POA: Diagnosis not present

## 2018-07-02 NOTE — Patient Instructions (Signed)
Medication Instructions:  Your physician recommends that you continue on your current medications as directed. Please refer to the Current Medication list given to you today.  If you need a refill on your cardiac medications before your next appointment, please call your pharmacy.   Lab work: NONE  If you have labs (blood work) drawn today and your tests are completely normal, you will receive your results only by: Marland Kitchen MyChart Message (if you have MyChart) OR . A paper copy in the mail If you have any lab test that is abnormal or we need to change your treatment, we will call you to review the results.  Testing/Procedures: You had an EKG today  Follow-Up: At [facility], you and your health needs are our priority.  As part of our continuing mission to provide you with exceptional heart care, we have created designated Provider Care Teams.  These Care Teams include your primary Cardiologist (physician) and Advanced Practice Providers (APPs -  Physician Assistants and Nurse Practitioners) who all work together to provide you with the care you need, when you need it. You will follow up with our office in 6 months.   Any Other Special Instructions Will Be Listed Below (If Applicable).

## 2018-12-14 ENCOUNTER — Other Ambulatory Visit: Payer: Self-pay | Admitting: Cardiology

## 2018-12-24 ENCOUNTER — Telehealth: Payer: Self-pay | Admitting: Cardiology

## 2018-12-24 NOTE — Telephone Encounter (Signed)
Virtual Visit Pre-Appointment Phone Call  Steps For Call:  1. Confirm consent - "In the setting of the current Covid19 crisis, you are scheduled for a (phone or video) visit with your provider on (date) at (time).  Just as we do with many in-office visits, in order for you to participate in this visit, we must obtain consent.  If you'd like, I can send this to your mychart (if signed up) or email for you to review.  Otherwise, I can obtain your verbal consent now.  All virtual visits are billed to your insurance company just like a normal visit would be.  By agreeing to a virtual visit, we'd like you to understand that the technology does not allow for your provider to perform an examination, and thus may limit your provider's ability to fully assess your condition.  Finally, though the technology is pretty good, we cannot assure that it will always work on either your or our end, and in the setting of a video visit, we may have to convert it to a phone-only visit.  In either situation, we cannot ensure that we have a secure connection.  Are you willing to proceed?" STAFF: Did the patient verbally acknowledge consent to treatment? YES  2. Give patient instructions for WebEx/MyChart download to smartphone or Doximity/Doxy.me if video visit (depending on what platform provider is using)  3. Advise patient to be prepared with any vital sign or heart rhythm information, their current medicines, and a piece of paper and pen handy for any instructions they may receive the day of their visit  4. Inform patient they will receive a phone call 15 minutes prior to their appointment time (may be from unknown caller ID) so they should be prepared to answer  5. Confirm that appointment type is correct in Epic appointment notes (video vs telephone)    TELEPHONE CALL NOTE  Joakim Morelock has been deemed a candidate for a follow-up tele-health visit to limit community exposure during the Covid-19 pandemic. I  spoke with the patient via phone to ensure availability of phone/video source, confirm preferred email & phone number, and discuss instructions and expectations.  I reminded Haydar Merrett to be prepared with any vital sign and/or heart rhythm information that could potentially be obtained via home monitoring, at the time of his visit. I reminded Crandall Ariano to expect a phone call at the time of his visit if his visit.  Minette Brine 12/24/2018 10:37 AM   DOWNLOADING THE WEBEX SOFTWARE TO SMARTPHONE  - If Apple, go to [employer] and type in WebEx in the search bar. Download [employer], the blue/green circle. The app is free but as with any other app downloads, their phone may require them to verify saved payment information or Apple password. The patient does NOT have to create an account.  - If Android, ask patient to go to [employer] and type in WebEx in the search bar. Download [employer], the blue/green circle. The app is free but as with any other app downloads, their phone may require them to verify saved payment information or Android password. The patient does NOT have to create an account.   CONSENT FOR TELE-HEALTH VISIT - PLEASE REVIEW  I hereby voluntarily request, consent and authorize [facility] and its employed or contracted physicians, physician assistants, nurse practitioners or other licensed health care professionals (the Practitioner), to provide me with telemedicine health care services (the Services") as deemed necessary by  the treating Practitioner. I acknowledge and consent to receive the Services by the Practitioner via telemedicine. I understand that the telemedicine visit will involve communicating with the Practitioner through live audiovisual communication technology and the disclosure of certain medical information by electronic transmission. I acknowledge that I have been given the opportunity to request an in-person assessment or other  available alternative prior to the telemedicine visit and am voluntarily participating in the telemedicine visit.  I understand that I have the right to withhold or withdraw my consent to the use of telemedicine in the course of my care at any time, without affecting my right to future care or treatment, and that the Practitioner or I may terminate the telemedicine visit at any time. I understand that I have the right to inspect all information obtained and/or recorded in the course of the telemedicine visit and may receive copies of available information for a reasonable fee.  I understand that some of the potential risks of receiving the Services via telemedicine include:   Delay or interruption in medical evaluation due to technological equipment failure or disruption;  Information transmitted may not be sufficient (e.g. poor resolution of images) to allow for appropriate medical decision making by the Practitioner; and/or   In rare instances, security protocols could fail, causing a breach of personal health information.  Furthermore, I acknowledge that it is my responsibility to provide information about my medical history, conditions and care that is complete and accurate to the best of my ability. I acknowledge that Practitioner's advice, recommendations, and/or decision may be based on factors not within their control, such as incomplete or inaccurate data provided by me or distortions of diagnostic images or specimens that may result from electronic transmissions. I understand that the practice of medicine is not an exact science and that Practitioner makes no warranties or guarantees regarding treatment outcomes. I acknowledge that I will receive a copy of this consent concurrently upon execution via email to the email address I last provided but may also request a printed copy by calling the office of [facility] [facility].    I understand that my insurance will be billed for this visit.   I have  read or had this consent read to me.  I understand the contents of this consent, which adequately explains the benefits and risks of the Services being provided via telemedicine.   I have been provided ample opportunity to ask questions regarding this consent and the Services and have had my questions answered to my satisfaction.  I give my informed consent for the services to be provided through the use of telemedicine in my medical care  By participating in this telemedicine visit I agree to the above.

## 2018-12-31 ENCOUNTER — Other Ambulatory Visit: Payer: Self-pay

## 2018-12-31 ENCOUNTER — Encounter: Payer: Self-pay | Admitting: Cardiology

## 2018-12-31 ENCOUNTER — Telehealth (INDEPENDENT_AMBULATORY_CARE_PROVIDER_SITE_OTHER): Payer: Medicare HMO | Admitting: Cardiology

## 2018-12-31 VITALS — BP 153/78 | HR 78 | Ht 70.0 in | Wt 240.0 lb

## 2018-12-31 DIAGNOSIS — I25118 Atherosclerotic heart disease of native coronary artery with other forms of angina pectoris: Secondary | ICD-10-CM | POA: Diagnosis not present

## 2018-12-31 DIAGNOSIS — I5032 Chronic diastolic (congestive) heart failure: Secondary | ICD-10-CM

## 2018-12-31 DIAGNOSIS — I110 Hypertensive heart disease with heart failure: Secondary | ICD-10-CM

## 2018-12-31 DIAGNOSIS — E782 Mixed hyperlipidemia: Secondary | ICD-10-CM

## 2018-12-31 DIAGNOSIS — I480 Paroxysmal atrial fibrillation: Secondary | ICD-10-CM

## 2018-12-31 DIAGNOSIS — Z7189 Other specified counseling: Secondary | ICD-10-CM

## 2018-12-31 NOTE — Patient Instructions (Addendum)
Medication Instructions:  Your physician recommends that you continue on your current medications as directed. Please refer to the Current Medication list given to you today.  If you need a refill on your cardiac medications before your next appointment, please call your pharmacy.   Lab work: None  If you have labs (blood work) drawn today and your tests are completely normal, you will receive your results only by: Marland Kitchen MyChart Message (if you have MyChart) OR . A paper copy in the mail If you have any lab test that is abnormal or we need to change your treatment, we will call you to review the results.  Testing/Procedures: None  Follow-Up: At [facility], you and your health needs are our priority.  As part of our continuing mission to provide you with exceptional heart care, we have created designated Provider Care Teams.  These Care Teams include your primary Cardiologist (physician) and Advanced Practice Providers (APPs -  Physician Assistants and Nurse Practitioners) who all work together to provide you with the care you need, when you need it. You will need a follow up virtual appointment in 3 months: Monday, 03/24/2019, at 9:00 am.

## 2018-12-31 NOTE — Progress Notes (Signed)
Virtual Visit via Video Note   This visit type was conducted due to national recommendations for restrictions regarding the COVID-19 Pandemic (e.g. social distancing) in an effort to limit this patient's exposure and mitigate transmission in our community.  Due to his co-morbid illnesses, this patient is at least at moderate risk for complications without adequate follow up.  This format is felt to be most appropriate for this patient at this time.  All issues noted in this document were discussed and addressed.  A limited physical exam was performed with this format.  Please refer to the patient's chart for his consent to telehealth for [facility].   Evaluation Performed:  Follow-up visit  Date:  12/31/2018   ID:  Jimmy Chapman, DOB December 27, 1940, MRN [id number]  Patient Location: Home Provider Location: [facility]  Electrophysiologist:  None   Chief Complaint:  CAD  History of Present Illness:    He has a history of CAD with multivessel PCI November 2014 proximal LAD left circumflex and right coronary artery EF 45%.  Subsequently 02/03/2015 PCI and drug-eluting stent to left circumflex and balloon angioplasty ostial right coronary artery for unstable angina EF 60%.  Other problems have included diastolic heart failure brief isolated paroxysmal atrial fibrillation during PCI  hypertension and hyperlipidemia. He was last seen  07/02/18.  At that time CAD was stable New York Heart Association class I heart failure was compensators blood pressure at target and he had no recurrence of atrial fibrillation.  We started the bit visit as a video he was unable to make the link and we used the audio.  He is limiting his contacts in the community practices distancing wears a mask outdoors understands and uses good handwashing and sanitation.  He has rare episodes of mild exertional chest pain relieved with rest he has not needed nitroglycerin.  No palpitation edema shortness of breath syncope or  TIA.  Compliant with no problem with his medications.  He had labs 2 months ago at the [facility] hospital was told the results were good but did not receive a printout.  In the current environment I do not think he needs come to my office for repeat at this time.  The patient does not have symptoms concerning for COVID-19 infection (fever, chills, cough, or new shortness of breath).    Past Medical History:  Diagnosis Date  . Anemia, chronic disease 10/03/2016  . Angina pectoris (HCC) 02/04/2015  . Aortic calcification (HCC) 10/11/2015  . Benign essential HTN 02/03/2015  . Benign prostatic hyperplasia with urinary hesitancy 08/17/2016  . Chronic diastolic heart failure (HCC) 04/16/2018  . Class 2 severe obesity due to excess calories with serious comorbidity and body mass index (BMI) of 36.0 to 36.9 in adult (HCC) 10/18/2015  . Coronary disease 10/11/2015   1. Multivessel PCI in Nov 2014 with 3 Xience stents to proximal LAD, LCF and RCA, EF 45% 2. 02/03/15 with PCI and resolute stennt LCF and PTCA of ostial RCA stenosis, EF 60% for unstable angina  . Degenerative lumbar disc 08/17/2016  . High risk medication use 10/15/2015  . Hyperlipidemia 02/03/2015  . Hypoxia 10/11/2015  . LAE (left atrial enlargement) 10/11/2015  . Left ventricular hypertrophy 10/11/2015  . Localized edema 11/14/2016  . Malaise and fatigue 10/11/2015  . Obstructive sleep apnea syndrome 04/10/2018   Wears CPAP  . Osteoarthritis 10/11/2015  . Paroxysmal atrial fibrillation (HCC) 03/02/2015   Brief during cardiac cath, not anticoagulated  . Prediabetes 10/11/2015  . Pulmonary fibrosis (  HCC) 10/11/2015  . Screening for prostate cancer 04/10/2018   Chooses no further testing.  . Vitamin B12 deficiency 10/03/2016   Past Surgical History:  Procedure Laterality Date  . APPENDECTOMY    . CORONARY ANGIOPLASTY WITH STENT PLACEMENT    . HERNIA REPAIR       No outpatient medications have been marked as taking for the 12/31/18 encounter (Appointment)  with Baldo Daub,  J, MD.     Allergies:   Patient has no known allergies.   Social History   Tobacco Use  . Smoking status: Former Games developermoker  . Smokeless tobacco: Former Engineer, waterUser  Substance Use Topics  . Alcohol use: Yes  . Drug use: Not Currently     Family Hx: The patient's family history includes Heart attack in his father; Hypertension in his mother.  ROS:   Please see the history of present illness.     All other systems reviewed and are negative.   Prior CV studies:   The following studies were reviewed today:    Labs/Other Tests and Data Reviewed:    EKG:  An ECG dated 07/03/18 was personally reviewed today and demonstrated:  sinus bradycardia 50 bpm first-degree heart block old anterior MI  Recent Labs: 05/07/2018: BUN 16; Creatinine, Ser 0.98; NT-Pro BNP 46; Potassium 3.7; Sodium 144   Recent Lipid Panel 04/10/2018 CMP was normal cholesterol 156 HDL 42 LDL 95 triglycerides 326  Wt Readings from Last 3 Encounters:  07/02/18 243 lb 3.2 oz (110.3 kg)  04/17/18 253 lb (114.8 kg)     Objective:    Vital Signs:  There were no vitals taken for this visit.   VITAL SIGNS:  reviewed he is alert and oriented mood and affect normal thought and cognition are normal no audible wheezing and no respiratory distress during conversation  ASSESSMENT & PLAN:    1. CAD, stable he has rare angina New York Heart Association class I to class II for now we will continue current medical treatment including dual antiplatelet beta-blocker statin and if having more significant frequent symptoms can add additional therapy such as Ranexa oral nitrates.  At this time I do not feel he requires repeat coronary angiography and if he develops severe limiting symptoms left heart catheterization be the appropriate diagnostic test with his multivessel multi-PCI disease. 2. Heart failure stable New York Heart Association class I continue his current loop diuretic and his next office visit with me 3  months check proBNP and renal function potassium 3. Hypertension stable continue guideline directed therapy including ACE beta-blocker diuretic 4. Atrial fibrillation stable no recurrence continue beta-blocker and dual antiplatelet therapy 5. Hyperlipidemia stable continue his high intensity statin  COVID-19 Education: The signs and symptoms of COVID-19 were discussed with the patient and how to seek care for testing (follow up with PCP or arrange E-visit).  The importance of social distancing was discussed today.  Time:   Today, I have spent 26 minutes with the patient with telehealth technology discussing the above problems.     Medication Adjustments/Labs and Tests Ordered: Current medicines are reviewed at length with the patient today.  Concerns regarding medicines are outlined above.   Tests Ordered: No orders of the defined types were placed in this encounter.   Medication Changes: No orders of the defined types were placed in this encounter.   Disposition:  Follow up in 3 month(s)  Signed, Norman Herrlich , MD  12/31/2018 2:46 PM    [facility]

## 2019-03-24 ENCOUNTER — Telehealth: Payer: Medicare HMO | Admitting: Cardiology

## 2019-04-06 NOTE — Progress Notes (Signed)
Cardiology Office Note:    Date:  04/07/2019   ID:  Jimmy Chapman, DOB 02-Sep-1941, MRN [id number]  PCP:  Raina Mina., MD  Cardiologist:  Shirlee More, MD    Referring MD: Raina Mina., MD    ASSESSMENT:    1. Coronary artery disease of native artery of native heart with stable angina pectoris ([place])   2. Chronic diastolic heart failure ([place])   3. Hypertensive heart disease with heart failure (HCC)   4. Paroxysmal atrial fibrillation ([place])   5. Hyperlipidemia, unspecified hyperlipidemia type    PLAN:    In order of problems listed above:  1. He is having stable exertional angina with known multivessel CAD continue medical treatment and if symptoms progress referral for coronary angiography I will see him back in the office in 3 months sooner 2. Stable compensated continue his current diuretic 3. Stable BP at target continue treatment including diuretic beta-blocker ACE inhibitor 4. Stable no clinical recurrence continue dual antiplatelet therapy 5. Continue statin high intensity recheck liver function lipid   Next appointment: 3 months   Medication Adjustments/Labs and Tests Ordered: Current medicines are reviewed at length with the patient today.  Concerns regarding medicines are outlined above.  No orders of the defined types were placed in this encounter.  No orders of the defined types were placed in this encounter.   Chief Complaint  Patient presents with  . Follow-up    for   . Coronary Artery Disease  . Congestive Heart Failure  . Hypertension  . Hyperlipidemia    History of Present Illness:    Jimmy Chapman is a 78 y.o. male with a hx of CAD with multivessel PCI November 2014 proximal LAD left circumflex and right coronary artery EF 45%.  Subsequently 02/03/2015 PCI and drug-eluting stent to left circumflex and balloon angioplasty ostial right coronary artery for unstable angina EF 60%.  Other problems have included diastolic heart failure brief  isolated paroxysmal atrial fibrillation during PCI  hypertension and hyperlipidemia  last seen 07/02/2018. Compliance with diet, lifestyle and medications: Yes  Since last visit he has had 3 episodes of exertional angina the last 1 occurred working outdoors in hot humid weather is very diaphoretic week did not take nitroglycerin.  We discussed the potential of coronary angiography but he is hesitant at this time I offered to increase his antianginal treatment adding oral nitrates he declined I will see him back in the office in 3 months and if symptoms progress he agrees to undergo coronary angiography.  No edema orthopnea wheezing palpitation or syncope. Past Medical History:  Diagnosis Date  . Anemia, chronic disease 10/03/2016  . Angina pectoris ([place]) 02/04/2015  . Aortic calcification ([place]) 10/11/2015  . Benign essential HTN 02/03/2015  . Benign prostatic hyperplasia with urinary hesitancy 08/17/2016  . Chronic diastolic heart failure ([place]) 04/16/2018  . Class 2 severe obesity due to excess calories with serious comorbidity and body mass index (BMI) of 36.0 to 36.9 in adult ([place]) 10/18/2015  . Coronary disease 10/11/2015   1. Multivessel PCI in Nov 2014 with 3 Xience stents to proximal LAD, LCF and RCA, EF 45% 2. 02/03/15 with PCI and resolute stennt LCF and PTCA of ostial RCA stenosis, EF 60% for unstable angina  . Degenerative lumbar disc 08/17/2016  . High risk medication use 10/15/2015  . Hyperlipidemia 02/03/2015  . Hypoxia 10/11/2015  . LAE (left atrial enlargement) 10/11/2015  . Left ventricular hypertrophy 10/11/2015  . Localized edema 11/14/2016  .  Malaise and fatigue 10/11/2015  . Obstructive sleep apnea syndrome 04/10/2018   Wears CPAP  . Osteoarthritis 10/11/2015  . Paroxysmal atrial fibrillation (HCC) 03/02/2015   Brief during cardiac cath, not anticoagulated  . Prediabetes 10/11/2015  . Pulmonary fibrosis (HCC) 10/11/2015  . Screening for prostate cancer 04/10/2018   Chooses no further  testing.  . Vitamin B12 deficiency 10/03/2016    Past Surgical History:  Procedure Laterality Date  . APPENDECTOMY    . CORONARY ANGIOPLASTY WITH STENT PLACEMENT    . HERNIA REPAIR      Current Medications: Current Meds  Medication Sig  . albuterol (PROVENTIL HFA) 108 (90 Base) MCG/ACT inhaler Inhale 2 puffs into the lungs every 6 (six) hours as needed for wheezing or shortness of breath.   . ALPRAZolam (XANAX) 0.5 MG tablet Take 0.5 mg by mouth at bedtime as needed for anxiety.  Marland Kitchen. amLODipine (NORVASC) 10 MG tablet Take 5 mg by mouth daily.  . Ascorbic Acid (VITAMIN C) 1000 MG tablet Take 1,000 mg by mouth 2 (two) times daily.  Marland Kitchen. aspirin EC 81 MG tablet Take 81 mg by mouth daily.  Marland Kitchen. atorvastatin (LIPITOR) 40 MG tablet Take 20 mg by mouth daily.   . benazepril (LOTENSIN) 40 MG tablet Take 40 mg by mouth daily.  . Carboxymethylcellulose Sod PF (REFRESH PLUS) 0.5 % SOLN Administer 2 drops to both eyes daily at 0600.  Marland Kitchen. Cholecalciferol (VITAMIN D PO) Take 1 capsule by mouth daily.  . clopidogrel (PLAVIX) 75 MG tablet Take 75 mg by mouth daily.  . finasteride (PROSCAR) 5 MG tablet Take 5 mg by mouth daily.  . fluticasone (FLONASE) 50 MCG/ACT nasal spray 2 sprays by Each Nare route Two (2) times a day.  . furosemide (LASIX) 40 MG tablet TAKE 2 TABLETS BY MOUTH IN THE MORNING AND 1 TABLET IN THE AFTERNOON  . metoprolol tartrate (LOPRESSOR) 25 MG tablet Take 25 mg by mouth 2 (two) times daily.  . Mometasone Furoate (ASMANEX HFA IN) Inhale 2 puffs into the lungs 2 (two) times daily.  . nitroGLYCERIN (NITROSTAT) 0.4 MG SL tablet Place 0.4 mg under the tongue every 5 (five) minutes as needed for chest pain.  . pantoprazole (PROTONIX) 40 MG tablet Take 40 mg by mouth daily.  . Potassium Chloride ER 20 MEQ TBCR Take 1 tablet by mouth 2 (two) times daily.  . Probiotic Product (PROBIOTIC DAILY PO) Take 1 capsule by mouth daily.  . tamsulosin (FLOMAX) 0.4 MG CAPS capsule Take 0.4 mg by mouth daily.      Allergies:   Patient has no known allergies.   Social History   Socioeconomic History  . Marital status: Married    Spouse name: Not on file  . Number of children: Not on file  . Years of education: Not on file  . Highest education level: Not on file  Occupational History  . Not on file  Social Needs  . Financial resource strain: Not on file  . Food insecurity    Worry: Not on file    Inability: Not on file  . Transportation needs    Medical: Not on file    Non-medical: Not on file  Tobacco Use  . Smoking status: Former Smoker    Types: Cigarettes  . Smokeless tobacco: Former NeurosurgeonUser    Types: Chew  Substance and Sexual Activity  . Alcohol use: Yes    Comment: occ  . Drug use: Not Currently  . Sexual activity: Not on file  Lifestyle  . Physical activity    Days per week: Not on file    Minutes per session: Not on file  . Stress: Not on file  Relationships  . Social Musicianconnections    Talks on phone: Not on file    Gets together: Not on file    Attends religious service: Not on file    Active member of club or organization: Not on file    Attends meetings of clubs or organizations: Not on file    Relationship status: Not on file  Other Topics Concern  . Not on file  Social History Narrative  . Not on file     Family History: The patient's family history includes Heart attack in his father; Hypertension in his mother. ROS:   Please see the history of present illness.    All other systems reviewed and are negative.  EKGs/Labs/Other Studies Reviewed:    The following studies were reviewed today:  EKG:  EKG ordered today and personally reviewed.  The ekg ordered today demonstrates sinus rhythm old anterior septal MI  Recent Labs: 05/07/2018: BUN 16; Creatinine, Ser 0.98; NT-Pro BNP 46; Potassium 3.7; Sodium 144  Recent Lipid Panel 04/10/2018: Cholesterol 156 triglyceride 326 HDL 42 LDL 95  Physical Exam:    VS:  Temp 98.9 F (37.2 C)   Ht 5\' 10"  (1.778  m)   Wt 234 lb 9.6 oz (106.4 kg)   SpO2 97%   BMI 33.66 kg/m     Wt Readings from Last 3 Encounters:  04/07/19 234 lb 9.6 oz (106.4 kg)  12/31/18 240 lb (108.9 kg)  07/02/18 243 lb 3.2 oz (110.3 kg)     GEN:  Well nourished, well developed in no acute distress HEENT: Normal NECK: No JVD; No carotid bruits LYMPHATICS: No lymphadenopathy CARDIAC: RRR, no murmurs, rubs, gallops RESPIRATORY:  Clear to auscultation without rales, wheezing or rhonchi  ABDOMEN: Soft, non-tender, non-distended MUSCULOSKELETAL:  No edema; No deformity  SKIN: Warm and dry NEUROLOGIC:  Alert and oriented x 3 PSYCHIATRIC:  Normal affect    Signed, Norman HerrlichBrian Forestine Macho, MD  04/07/2019 8:50 AM    [facility]

## 2019-04-07 ENCOUNTER — Encounter: Payer: Self-pay | Admitting: Cardiology

## 2019-04-07 ENCOUNTER — Other Ambulatory Visit: Payer: Self-pay

## 2019-04-07 ENCOUNTER — Ambulatory Visit (INDEPENDENT_AMBULATORY_CARE_PROVIDER_SITE_OTHER): Payer: Medicare HMO | Admitting: Cardiology

## 2019-04-07 VITALS — BP 130/68 | HR 50 | Temp 98.9°F | Ht 70.0 in | Wt 234.6 lb

## 2019-04-07 DIAGNOSIS — I110 Hypertensive heart disease with heart failure: Secondary | ICD-10-CM

## 2019-04-07 DIAGNOSIS — I25118 Atherosclerotic heart disease of native coronary artery with other forms of angina pectoris: Secondary | ICD-10-CM | POA: Diagnosis not present

## 2019-04-07 DIAGNOSIS — E785 Hyperlipidemia, unspecified: Secondary | ICD-10-CM

## 2019-04-07 DIAGNOSIS — I480 Paroxysmal atrial fibrillation: Secondary | ICD-10-CM | POA: Diagnosis not present

## 2019-04-07 DIAGNOSIS — I5032 Chronic diastolic (congestive) heart failure: Secondary | ICD-10-CM | POA: Diagnosis not present

## 2019-04-07 LAB — COMPREHENSIVE METABOLIC PANEL
ALT: 30 IU/L (ref 0–44)
AST: 27 IU/L (ref 0–40)
Albumin/Globulin Ratio: 2.2 (ref 1.2–2.2)
Albumin: 4.6 g/dL (ref 3.7–4.7)
Alkaline Phosphatase: 93 IU/L (ref 39–117)
BUN/Creatinine Ratio: 20 (ref 10–24)
BUN: 20 mg/dL (ref 8–27)
Bilirubin Total: 0.5 mg/dL (ref 0.0–1.2)
CO2: 24 mmol/L (ref 20–29)
Calcium: 9.9 mg/dL (ref 8.6–10.2)
Chloride: 101 mmol/L (ref 96–106)
Creatinine, Ser: 0.98 mg/dL (ref 0.76–1.27)
GFR calc Af Amer: 87 mL/min/{1.73_m2} (ref >59)
GFR calc non Af Amer: 75 mL/min/{1.73_m2} (ref >59)
Globulin, Total: 2.1 g/dL (ref 1.5–4.5)
Glucose: 121 mg/dL — ABNORMAL HIGH (ref 65–99)
Potassium: 4.1 mmol/L (ref 3.5–5.2)
Sodium: 144 mmol/L (ref 134–144)
Total Protein: 6.7 g/dL (ref 6.0–8.5)

## 2019-04-07 LAB — LIPID PANEL
Chol/HDL Ratio: 2.8 ratio (ref 0.0–5.0)
Cholesterol, Total: 166 mg/dL (ref 100–199)
HDL: 60 mg/dL (ref >39)
LDL Calculated: 44 mg/dL (ref 0–99)
Triglycerides: 311 mg/dL — ABNORMAL HIGH (ref 0–149)
VLDL Cholesterol Cal: 62 mg/dL — ABNORMAL HIGH (ref 5–40)

## 2019-04-07 LAB — PRO B NATRIURETIC PEPTIDE: NT-Pro BNP: 63 pg/mL (ref 0–486)

## 2019-04-07 NOTE — Patient Instructions (Addendum)
Medication Instructions:  Your physician recommends that you continue on your current medications as directed. Please refer to the Current Medication list given to you today.  If you need a refill on your cardiac medications before your next appointment, please call your pharmacy.   Lab work: Your physician recommends that you return for lab work today: CMP, ProBNP, lipid panel.   If you have labs (blood work) drawn today and your tests are completely normal, you will receive your results only by: Marland Kitchen MyChart Message (if you have MyChart) OR . A paper copy in the mail If you have any lab test that is abnormal or we need to change your treatment, we will call you to review the results.  Testing/Procedures: You had an EKG today.   Follow-Up: At [facility], you and your health needs are our priority.  As part of our continuing mission to provide you with exceptional heart care, we have created designated Provider Care Teams.  These Care Teams include your primary Cardiologist (physician) and Advanced Practice Providers (APPs -  Physician Assistants and Nurse Practitioners) who all work together to provide you with the care you need, when you need it. You will need a follow up appointment in 3 months.  Please call our office 2 months in advance to schedule this appointment.     Nitroglycerin sublingual tablets What is this medicine? NITROGLYCERIN (nye troe GLI ser in) is a type of vasodilator. It relaxes blood vessels, increasing the blood and oxygen supply to your heart. This medicine is used to relieve chest pain caused by angina. It is also used to prevent chest pain before activities like climbing stairs, going outdoors in cold weather, or sexual activity. This medicine may be used for other purposes; ask your health care provider or pharmacist if you have questions. COMMON BRAND NAME(S): Nitroquick, Nitrostat, Nitrotab What should I tell my health care provider before I take this  medicine? They need to know if you have any of these conditions:  anemia  head injury, recent stroke, or bleeding in the brain  liver disease  previous heart attack  an unusual or allergic reaction to nitroglycerin, other medicines, foods, dyes, or preservatives  pregnant or trying to get pregnant  breast-feeding How should I use this medicine? Take this medicine by mouth as needed. At the first sign of an angina attack (chest pain or tightness) place one tablet under your tongue. You can also take this medicine 5 to 10 minutes before an event likely to produce chest pain. Follow the directions on the prescription label. Let the tablet dissolve under the tongue. Do not swallow whole. Replace the dose if you accidentally swallow it. It will help if your mouth is not dry. Saliva around the tablet will help it to dissolve more quickly. Do not eat or drink, smoke or chew tobacco while a tablet is dissolving. If you are not better within 5 minutes after taking ONE dose of nitroglycerin, call 9-1-1 immediately to seek emergency medical care. Do not take more than 3 nitroglycerin tablets over 15 minutes. If you take this medicine often to relieve symptoms of angina, your doctor or health care professional may provide you with different instructions to manage your symptoms. If symptoms do not go away after following these instructions, it is important to call 9-1-1 immediately. Do not take more than 3 nitroglycerin tablets over 15 minutes. Talk to your pediatrician regarding the use of this medicine in children. Special care may be needed. Overdosage:  If you think you have taken too much of this medicine contact a poison control center or emergency room at once. NOTE: This medicine is only for you. Do not share this medicine with others. What if I miss a dose? This does not apply. This medicine is only used as needed. What may interact with this medicine? Do not take this medicine with any of the  following medications:  certain migraine medicines like ergotamine and dihydroergotamine (DHE)  medicines used to treat erectile dysfunction like sildenafil, tadalafil, and vardenafil  riociguat This medicine may also interact with the following medications:  alteplase  aspirin  heparin  medicines for high blood pressure  medicines for mental depression  other medicines used to treat angina  phenothiazines like chlorpromazine, mesoridazine, prochlorperazine, thioridazine This list may not describe all possible interactions. Give your health care provider a list of all the medicines, herbs, non-prescription drugs, or dietary supplements you use. Also tell them if you smoke, drink alcohol, or use illegal drugs. Some items may interact with your medicine. What should I watch for while using this medicine? Tell your doctor or health care professional if you feel your medicine is no longer working. Keep this medicine with you at all times. Sit or lie down when you take your medicine to prevent falling if you feel dizzy or faint after using it. Try to remain calm. This will help you to feel better faster. If you feel dizzy, take several deep breaths and lie down with your feet propped up, or bend forward with your head resting between your knees. You may get drowsy or dizzy. Do not drive, use machinery, or do anything that needs mental alertness until you know how this drug affects you. Do not stand or sit up quickly, especially if you are an older patient. This reduces the risk of dizzy or fainting spells. Alcohol can make you more drowsy and dizzy. Avoid alcoholic drinks. Do not treat yourself for coughs, colds, or pain while you are taking this medicine without asking your doctor or health care professional for advice. Some ingredients may increase your blood pressure. What side effects may I notice from receiving this medicine? Side effects that you should report to your doctor or health  care professional as soon as possible:  blurred vision  dry mouth  skin rash  sweating  the feeling of extreme pressure in the head  unusually weak or tired Side effects that usually do not require medical attention (report to your doctor or health care professional if they continue or are bothersome):  flushing of the face or neck  headache  irregular heartbeat, palpitations  nausea, vomiting This list may not describe all possible side effects. Call your doctor for medical advice about side effects. You may report side effects to FDA at 1-800-FDA-1088. Where should I keep my medicine? Keep out of the reach of children. Store at room temperature between 20 and 25 degrees C (68 and 77 degrees F). Store in Retail buyeroriginal container. Protect from light and moisture. Keep tightly closed. Throw away any unused medicine after the expiration date. NOTE: This sheet is a summary. It may not cover all possible information. If you have questions about this medicine, talk to your doctor, pharmacist, or health care provider.  2020 Elsevier/Gold Standard (2013-06-26 17:57:36)

## 2019-04-08 ENCOUNTER — Telehealth: Payer: Self-pay

## 2019-04-08 NOTE — Telephone Encounter (Signed)
Patient informed of results with no changes in treatment at this time per Dr Bettina Gavia.  Patient agreed to plan and verbalized understanding.

## 2019-04-23 ENCOUNTER — Telehealth: Payer: Self-pay | Admitting: Cardiology

## 2019-04-23 DIAGNOSIS — I440 Atrioventricular block, first degree: Secondary | ICD-10-CM | POA: Diagnosis not present

## 2019-04-23 DIAGNOSIS — I25110 Atherosclerotic heart disease of native coronary artery with unstable angina pectoris: Secondary | ICD-10-CM | POA: Diagnosis not present

## 2019-04-23 DIAGNOSIS — I517 Cardiomegaly: Secondary | ICD-10-CM | POA: Diagnosis not present

## 2019-04-23 DIAGNOSIS — J8410 Pulmonary fibrosis, unspecified: Secondary | ICD-10-CM | POA: Diagnosis not present

## 2019-04-23 DIAGNOSIS — E538 Deficiency of other specified B group vitamins: Secondary | ICD-10-CM | POA: Diagnosis not present

## 2019-04-23 DIAGNOSIS — I25118 Atherosclerotic heart disease of native coronary artery with other forms of angina pectoris: Secondary | ICD-10-CM | POA: Diagnosis not present

## 2019-04-23 DIAGNOSIS — Z7902 Long term (current) use of antithrombotics/antiplatelets: Secondary | ICD-10-CM | POA: Diagnosis not present

## 2019-04-23 DIAGNOSIS — R001 Bradycardia, unspecified: Secondary | ICD-10-CM | POA: Diagnosis not present

## 2019-04-23 DIAGNOSIS — I10 Essential (primary) hypertension: Secondary | ICD-10-CM | POA: Diagnosis not present

## 2019-04-23 DIAGNOSIS — I200 Unstable angina: Secondary | ICD-10-CM | POA: Diagnosis not present

## 2019-04-23 DIAGNOSIS — R0602 Shortness of breath: Secondary | ICD-10-CM | POA: Diagnosis not present

## 2019-04-23 DIAGNOSIS — Z7982 Long term (current) use of aspirin: Secondary | ICD-10-CM | POA: Diagnosis not present

## 2019-04-23 DIAGNOSIS — E782 Mixed hyperlipidemia: Secondary | ICD-10-CM | POA: Diagnosis not present

## 2019-04-23 DIAGNOSIS — I2510 Atherosclerotic heart disease of native coronary artery without angina pectoris: Secondary | ICD-10-CM | POA: Diagnosis not present

## 2019-04-23 DIAGNOSIS — I480 Paroxysmal atrial fibrillation: Secondary | ICD-10-CM | POA: Diagnosis not present

## 2019-04-23 DIAGNOSIS — I5032 Chronic diastolic (congestive) heart failure: Secondary | ICD-10-CM | POA: Diagnosis not present

## 2019-04-23 DIAGNOSIS — R7303 Prediabetes: Secondary | ICD-10-CM | POA: Diagnosis not present

## 2019-04-23 DIAGNOSIS — D638 Anemia in other chronic diseases classified elsewhere: Secondary | ICD-10-CM | POA: Diagnosis not present

## 2019-04-23 DIAGNOSIS — Z79899 Other long term (current) drug therapy: Secondary | ICD-10-CM | POA: Diagnosis not present

## 2019-04-23 DIAGNOSIS — R072 Precordial pain: Secondary | ICD-10-CM | POA: Diagnosis not present

## 2019-04-23 DIAGNOSIS — I110 Hypertensive heart disease with heart failure: Secondary | ICD-10-CM | POA: Diagnosis not present

## 2019-04-23 DIAGNOSIS — R3911 Hesitancy of micturition: Secondary | ICD-10-CM | POA: Diagnosis not present

## 2019-04-23 DIAGNOSIS — R079 Chest pain, unspecified: Secondary | ICD-10-CM | POA: Diagnosis not present

## 2019-04-23 DIAGNOSIS — N401 Enlarged prostate with lower urinary tract symptoms: Secondary | ICD-10-CM | POA: Diagnosis not present

## 2019-04-23 DIAGNOSIS — Z955 Presence of coronary angioplasty implant and graft: Secondary | ICD-10-CM | POA: Diagnosis not present

## 2019-04-24 DIAGNOSIS — I440 Atrioventricular block, first degree: Secondary | ICD-10-CM | POA: Diagnosis not present

## 2019-04-24 DIAGNOSIS — I110 Hypertensive heart disease with heart failure: Secondary | ICD-10-CM | POA: Diagnosis not present

## 2019-04-24 DIAGNOSIS — I2510 Atherosclerotic heart disease of native coronary artery without angina pectoris: Secondary | ICD-10-CM | POA: Diagnosis not present

## 2019-04-24 DIAGNOSIS — R001 Bradycardia, unspecified: Secondary | ICD-10-CM | POA: Diagnosis not present

## 2019-04-24 DIAGNOSIS — Z7901 Long term (current) use of anticoagulants: Secondary | ICD-10-CM | POA: Diagnosis not present

## 2019-04-24 DIAGNOSIS — Z955 Presence of coronary angioplasty implant and graft: Secondary | ICD-10-CM | POA: Diagnosis not present

## 2019-04-24 DIAGNOSIS — I200 Unstable angina: Secondary | ICD-10-CM | POA: Diagnosis not present

## 2019-04-24 DIAGNOSIS — I5022 Chronic systolic (congestive) heart failure: Secondary | ICD-10-CM | POA: Diagnosis not present

## 2019-04-24 DIAGNOSIS — I480 Paroxysmal atrial fibrillation: Secondary | ICD-10-CM | POA: Diagnosis not present

## 2019-04-24 DIAGNOSIS — E785 Hyperlipidemia, unspecified: Secondary | ICD-10-CM | POA: Diagnosis not present

## 2019-04-24 DIAGNOSIS — I25118 Atherosclerotic heart disease of native coronary artery with other forms of angina pectoris: Secondary | ICD-10-CM | POA: Diagnosis not present

## 2019-04-25 DIAGNOSIS — R072 Precordial pain: Secondary | ICD-10-CM | POA: Diagnosis not present

## 2019-04-25 DIAGNOSIS — Z955 Presence of coronary angioplasty implant and graft: Secondary | ICD-10-CM | POA: Diagnosis not present

## 2019-04-25 DIAGNOSIS — I5030 Unspecified diastolic (congestive) heart failure: Secondary | ICD-10-CM | POA: Diagnosis not present

## 2019-04-25 DIAGNOSIS — I110 Hypertensive heart disease with heart failure: Secondary | ICD-10-CM | POA: Diagnosis not present

## 2019-04-25 DIAGNOSIS — R000 Tachycardia, unspecified: Secondary | ICD-10-CM | POA: Diagnosis not present

## 2019-04-25 DIAGNOSIS — E785 Hyperlipidemia, unspecified: Secondary | ICD-10-CM | POA: Diagnosis not present

## 2019-04-25 DIAGNOSIS — G4733 Obstructive sleep apnea (adult) (pediatric): Secondary | ICD-10-CM | POA: Diagnosis not present

## 2019-04-25 DIAGNOSIS — I25110 Atherosclerotic heart disease of native coronary artery with unstable angina pectoris: Secondary | ICD-10-CM | POA: Diagnosis not present

## 2019-04-28 ENCOUNTER — Ambulatory Visit (INDEPENDENT_AMBULATORY_CARE_PROVIDER_SITE_OTHER): Payer: Medicare HMO | Admitting: Cardiology

## 2019-04-28 ENCOUNTER — Other Ambulatory Visit: Payer: Self-pay

## 2019-04-28 ENCOUNTER — Encounter: Payer: Self-pay | Admitting: Cardiology

## 2019-04-28 VITALS — BP 120/60 | HR 50 | Ht 70.0 in | Wt 233.2 lb

## 2019-04-28 DIAGNOSIS — E785 Hyperlipidemia, unspecified: Secondary | ICD-10-CM

## 2019-04-28 DIAGNOSIS — I110 Hypertensive heart disease with heart failure: Secondary | ICD-10-CM | POA: Diagnosis not present

## 2019-04-28 DIAGNOSIS — I25708 Atherosclerosis of coronary artery bypass graft(s), unspecified, with other forms of angina pectoris: Secondary | ICD-10-CM

## 2019-04-28 DIAGNOSIS — R001 Bradycardia, unspecified: Secondary | ICD-10-CM

## 2019-04-28 DIAGNOSIS — I480 Paroxysmal atrial fibrillation: Secondary | ICD-10-CM | POA: Diagnosis not present

## 2019-04-28 HISTORY — DX: Atherosclerosis of coronary artery bypass graft(s), unspecified, with other forms of angina pectoris: I25.708

## 2019-04-28 HISTORY — DX: Bradycardia, unspecified: R00.1

## 2019-04-28 MED ORDER — RANOLAZINE ER 500 MG PO TB12: 500.0000 mg | ORAL_TABLET | Freq: Two times a day (BID) | ORAL | 1 refills | Status: DC

## 2019-04-28 NOTE — Patient Instructions (Signed)
Medication Instructions:  Your physician has recommended you make the following change in your medication:   CHANGE Metoprolol to 12.5 mg (0.5 tablet) twice daily.   CONTINUE Imdur 30mg  daily.   IF headache does not improve, please let us know.  START Ranolazine (Ranexa) 500mg  (one tablet) twice daily.  If you need a refill on your cardiac medications before your next appointment, please call your pharmacy.   Lab work: None today.  If you have labs (blood work) drawn today and your tests are completely normal, you will receive your results only by: Marland Kitchen MyChart Message (if you have MyChart) OR . A paper copy in the mail If you have any lab test that is abnormal or we need to change your treatment, we will call you to review the results.  Testing/Procedures: You had an EKG today.  Follow-Up: At [facility], you and your health needs are our priority.  As part of our continuing mission to provide you with exceptional heart care, we have created designated Provider Care Teams.  These Care Teams include your primary Cardiologist (physician) and Advanced Practice Providers (APPs -  Physician Assistants and Nurse Practitioners) who all work together to provide you with the care you need, when you need it. . You will need a follow up appointment in 1 month.  Any Other Special Instructions Will Be Listed Below (If Applicable).  Check blood pressure daily. Call our office if systolic blood pressure (top number) is consistently >130.  Continue Imdur (isosorbide mononitrate) - if headache does not improve, please call our office.

## 2019-04-28 NOTE — Progress Notes (Signed)
Cardiology Office Note:    Date:  04/28/2019   ID:  Jimmy Chapman, DOB 1941/05/17, MRN [id number]  PCP:  Raina Mina., MD  Cardiologist:  Shirlee More, MD    Referring MD: Raina Mina., MD    ASSESSMENT:    1. Coronary artery disease of bypass graft of native heart with stable angina pectoris ([place])   2. Hypertensive heart disease with heart failure (HCC)   3. Paroxysmal atrial fibrillation ([place])   4. Hyperlipidemia, unspecified hyperlipidemia type    PLAN:    In order of problems listed above:  1. CAD -  Endorses some anginal symptoms since leaving hospital. Cath 04/25/19 with mod RCA/LAD stenosis recommended for medical management. Continue Imdur 30mg  that was started at [facility] - he will tell me if headache becomes bothersome. Start Ranexa 500mg  BID for continued anginal symptoms. Will recheck EKG in 1 month and consider up-titration at that time. GDMT aspirin, statin, metoprolol, CCB. DAPT aspirin and plavix, denies bleeding complications.  2. Hypertensive heart disease with diastolic heart failure - BP well controlled today. Euvolemic on exam. NYHA class II. Continue present anti-hypertensive and diuretic regimen.  3. PAF  - Denies palpitations, irregular HR. None noted during recent hospitalization. EKG today SB. No indication for anticoagulation.  4. HLD - Recent lipid panel LDL <70, at goal. Continue Lipitor 40mg  daily. 5. Sinus bradycardia - SB rate 50 today on EKG. Notation from hospital visit to decrease Metoprolol, but he tells me he has not. Endorses dome dizziness and fatigue likely due to bradycardia. Tells me HR got as low as 29 bpm in the hospital. Will decrease Metoprolol 12.5mg  BID.  Next appointment: 1 month with EKG  Medication Adjustments/Labs and Tests Ordered: Current medicines are reviewed at length with the patient today.  Concerns regarding medicines are outlined above.  Orders Placed This Encounter  Procedures  . EKG 12-Lead   Meds ordered this  encounter  Medications  . ranolazine (RANEXA) 500 MG 12 hr tablet    Sig: Take 1 tablet (500 mg total) by mouth 2 (two) times daily.    Dispense:  30 tablet    Refill:  1    Chief Complaint  Patient presents with  . Follow-up  . Coronary Artery Disease    History of Present Illness:    Jimmy Chapman is a 78 y.o. male with a hx of CAD with multivessel PCI 07/2013 proimal LAD, LCx, RCA EF 45% - subsequently 02/03/2015 PCI and DES to LCx and balloon angioplasty ostial RCA for unstable angina EF 78%, diastolic HF, brief isolated PAF during PCI, HTN, HLD, OSA last seen 04/07/19.  At his last office visit described stable exertional anginal and declined ischemic evaluation at that time. See at [facility] 04/23/19 for chest pain. Troponin I during admission 5>5>7>7>9. Underwent LHC on 04/24/19 showing EF 60%, mod stenosis RCA/distal LAD - recommended for medial therapy due to LAD lesion <22mm diameter, tortuous and RCA stenosis highly calcific angulated lesion with diffuse disease . Started on Imdur 30mg  daily day of discharge. Day of discharge had symptoms concerning for afib RVR - per discharge report telemetry was nonrevealing, symptoms thought to be not using CPAP in hospital, and mild firal URI flare. Metoprolol reduced to 12.5 daily for bradycardia prior to discharge. Of note, K 3.3 day of discharge.  Tells me he went to Dr. Willette Pa office Wednesday for his annual exam, had been doing yard work that morning, reported chest pain and was advised to report to the  ED. Reports maybe having an episode of chest pain Friday after leaving the hospital. Tells me he hasn't done much activity to know if his symptoms have improved with the addition of Imdur. Does notate mild headache - as he has only taken 2 doses he thinks he can tolerate it. Endorses some dizziness when standing and some fatigue. Endorses shortness of breath with activity that is about the same as when last seen by me.  Compliance with diet,  lifestyle and medications: Yes  Past Medical History:  Diagnosis Date  . Anemia, chronic disease 10/03/2016  . Angina pectoris (HCC) 02/04/2015  . Aortic calcification (HCC) 10/11/2015  . Benign essential HTN 02/03/2015  . Benign prostatic hyperplasia with urinary hesitancy 08/17/2016  . Chronic diastolic heart failure (HCC) 04/16/2018  . Class 2 severe obesity due to excess calories with serious comorbidity and body mass index (BMI) of 36.0 to 36.9 in adult (HCC) 10/18/2015  . Coronary disease 10/11/2015   1. Multivessel PCI in Nov 2014 with 3 Xience stents to proximal LAD, LCF and RCA, EF 45% 2. 02/03/15 with PCI and resolute stennt LCF and PTCA of ostial RCA stenosis, EF 60% for unstable angina (b.) 04/25/19 cath with mod RCA and mod LAD disease recommended for med therapy with normal EF  . Degenerative lumbar disc 08/17/2016  . High risk medication use 10/15/2015  . Hyperlipidemia 02/03/2015  . Hypoxia 10/11/2015  . LAE (left atrial enlargement) 10/11/2015  . Left ventricular hypertrophy 10/11/2015  . Localized edema 11/14/2016  . Malaise and fatigue 10/11/2015  . Obstructive sleep apnea syndrome 04/10/2018   Wears CPAP  . Osteoarthritis 10/11/2015  . Paroxysmal atrial fibrillation (HCC) 03/02/2015   Brief during cardiac cath, not anticoagulated  . Prediabetes 10/11/2015  . Pulmonary fibrosis (HCC) 10/11/2015  . Screening for prostate cancer 04/10/2018   Chooses no further testing.  . Vitamin B12 deficiency 10/03/2016    Past Surgical History:  Procedure Laterality Date  . APPENDECTOMY    . CORONARY ANGIOPLASTY WITH STENT PLACEMENT    . HERNIA REPAIR      Current Medications: Current Meds  Medication Sig  . acetaminophen (TYLENOL) 325 MG tablet Take 650 mg by mouth every 6 (six) hours as needed.  Marland Kitchen. albuterol (VENTOLIN HFA) 108 (90 Base) MCG/ACT inhaler Inhale 2 puffs into the lungs every 6 (six) hours as needed.  . ALPRAZolam (XANAX) 0.5 MG tablet Take 0.5 mg by mouth at bedtime as needed for  anxiety.  Marland Kitchen. amLODipine (NORVASC) 10 MG tablet Take 5 mg by mouth daily.  . Ascorbic Acid (VITAMIN C) 1000 MG tablet Take 1,000 mg by mouth 2 (two) times daily.  Marland Kitchen. aspirin EC 81 MG tablet Take 81 mg by mouth daily.  Marland Kitchen. atorvastatin (LIPITOR) 80 MG tablet Take 40 mg by mouth daily.   . benazepril (LOTENSIN) 40 MG tablet Take 40 mg by mouth daily.  . Carboxymethylcellulose Sod PF (REFRESH PLUS) 0.5 % SOLN Administer 2 drops to both eyes daily at 0600.  Marland Kitchen. Cholecalciferol (VITAMIN D PO) Take 1 capsule by mouth daily.  . clopidogrel (PLAVIX) 75 MG tablet Take 75 mg by mouth daily.  . cyanocobalamin 1000 MCG tablet Take 1,000 mcg by mouth daily.  . finasteride (PROSCAR) 5 MG tablet Take 5 mg by mouth daily.  . fluticasone (FLONASE) 50 MCG/ACT nasal spray 2 sprays by Each Nare route Two (2) times a day.  . furosemide (LASIX) 40 MG tablet TAKE 2 TABLETS BY MOUTH IN THE MORNING AND 1 TABLET  IN THE AFTERNOON  . isosorbide mononitrate (IMDUR) 30 MG 24 hr tablet Take 1 tablet by mouth daily.  . metoprolol tartrate (LOPRESSOR) 25 MG tablet Take 12.5 mg by mouth 2 (two) times daily.  . Mometasone Furoate (ASMANEX HFA IN) Inhale 2 puffs into the lungs 2 (two) times daily.  . nitroGLYCERIN (NITROSTAT) 0.4 MG SL tablet Place 0.4 mg under the tongue every 5 (five) minutes as needed for chest pain.  . pantoprazole (PROTONIX) 40 MG tablet Take 40 mg by mouth daily.  . Potassium Chloride ER 20 MEQ TBCR Take 1 tablet by mouth 2 (two) times daily.  . Probiotic Product (PROBIOTIC DAILY PO) Take 1 capsule by mouth daily.  . tamsulosin (FLOMAX) 0.4 MG CAPS capsule Take 0.4 mg by mouth daily.     Allergies:   Patient has no known allergies.   Social History   Socioeconomic History  . Marital status: Married    Spouse name: Not on file  . Number of children: Not on file  . Years of education: Not on file  . Highest education level: Not on file  Occupational History  . Not on file  Social Needs  . Financial  resource strain: Not on file  . Food insecurity    Worry: Not on file    Inability: Not on file  . Transportation needs    Medical: Not on file    Non-medical: Not on file  Tobacco Use  . Smoking status: Former Smoker    Types: Cigarettes  . Smokeless tobacco: Former NeurosurgeonUser    Types: Chew  Substance and Sexual Activity  . Alcohol use: Yes    Comment: occ  . Drug use: Not Currently  . Sexual activity: Not on file  Lifestyle  . Physical activity    Days per week: Not on file    Minutes per session: Not on file  . Stress: Not on file  Relationships  . Social Musicianconnections    Talks on phone: Not on file    Gets together: Not on file    Attends religious service: Not on file    Active member of club or organization: Not on file    Attends meetings of clubs or organizations: Not on file    Relationship status: Not on file  Other Topics Concern  . Not on file  Social History Narrative  . Not on file     Family History: The patient's family history includes Heart attack in his father; Hypertension in his mother. ROS:   Review of Systems  Constitution: Negative for chills, fever and malaise/fatigue.  Cardiovascular: Positive for chest pain and dyspnea on exertion. Negative for irregular heartbeat, leg swelling, near-syncope, orthopnea, palpitations and syncope.  Respiratory: Negative for cough, shortness of breath and wheezing.   Gastrointestinal: Negative for nausea and vomiting.  Neurological: Positive for dizziness and light-headedness. Negative for weakness.   Please see the history of present illness.    All other systems reviewed and are negative.  EKGs/Labs/Other Studies Reviewed:    The following studies were reviewed today:  Cardiac cath 04/25/19 Conclusions Diagnostic Procedure Summary Moderate stenosis of mid RCA Moderate stenosis of distal LAD normal LV function Diagnostic Procedure Recommendations LAD lesion is very distal, artery segment <362mm diameter,  tortuous. Best treated medically Medical therapy trial also initial preference for RCA stenosis based on highly calcific angulated lesion, diffuse disease, and small diameter 2.0-2.5 reference segment, preserved LV function, no unstable rest symptoms PCI of RCA is future option  of fails medical therapy  Signatures  Electronically signed by Eliot FordH. Barrett Cheek, MD, FACC(Diagnostic  Physician) on 04/25/2019 13:47  Angiographic findings  Cardiac Arteries and Lesion Findings LMCA: 0% and Normal. LAD: Single stenosis.  Lesion on Dist LAD: Distal subsection.75% stenosis 3 mm length . Pre  procedure TIMI III flow was noted. Good run off was present. The lesion was  diagnosed as Low Risk (A). LCx: Normal. RCA: Abnormal and Single stenosis.  Lesion on Mid RCA: Mid subsection.75% stenosis 5 mm length . Pre procedure  TIMI III flow was noted. Good run off was present. The lesion was diagnosed  as Moderate Risk (B). The lesion was eccentric and heavily calcified.The  lesion showed evidence of not present thrombus and with smooth contour. Procedure Data Procedure Date Date: 04/24/2019 Start: 16:33 Fluoroscopy Time: Diagnostic: 9:00 minutes. Total: 9:00 minutes.  Medical History Allergies  - No Known Allergies allergy. Admission Data Admission Date: 04/23/2019  Coronary Tree  Dominance: Right VA LV function assessed WG:NFAOZHas:Normal. Ejection Fraction  - Method: LV gram. EF%: 60.  EKG:  EKG ordered today and personally reviewed.  The ekg ordered today demonstrates SB rate 50 with stable t-wave inversion in AVR, V1, V2, V3 when compared to previous.   Recent Labs: 04/07/2019: ALT 30; BUN 20; Creatinine, Ser 0.98; NT-Pro BNP 63; Potassium 4.1; Sodium 144  Recent Lipid Panel    Component Value Date/Time   CHOL 166 04/07/2019 0918   TRIG 311 (H) 04/07/2019 0918   HDL 60 04/07/2019 0918   CHOLHDL 2.8 04/07/2019 0918   LDLCALC 44 04/07/2019 0918    Physical Exam:    VS:  BP 120/60 (BP  Location: Right Arm, Patient Position: Sitting, Cuff Size: Large)   Pulse (!) 50   Ht 5\' 10"  (1.778 m)   Wt 233 lb 3.2 oz (105.8 kg)   SpO2 96%   BMI 33.46 kg/m     Wt Readings from Last 3 Encounters:  04/28/19 233 lb 3.2 oz (105.8 kg)  04/07/19 234 lb 9.6 oz (106.4 kg)  12/31/18 240 lb (108.9 kg)     GEN:  Well nourished, well developed in no acute distress HEENT: Normal NECK: No JVD; No carotid bruits LYMPHATICS: No lymphadenopathy CARDIAC: RRR, no murmurs, rubs, gallops RESPIRATORY:  Clear to auscultation without rales, wheezing or rhonchi  ABDOMEN: Soft, non-tender, non-distended MUSCULOSKELETAL:  No edema; No deformity  SKIN: Warm and dry. Scattered small ecchymosis to bilateral arms. R wrist cath site clean, dry without ecchymosis nor exudate.  NEUROLOGIC:  Alert and oriented x 3 PSYCHIATRIC:  Normal affect    Signed, Norman HerrlichBrian Mellody Masri, MD  04/28/2019 10:38 AM    [facility]

## 2019-05-02 DIAGNOSIS — H524 Presbyopia: Secondary | ICD-10-CM | POA: Diagnosis not present

## 2019-05-02 DIAGNOSIS — H52223 Regular astigmatism, bilateral: Secondary | ICD-10-CM | POA: Diagnosis not present

## 2019-05-02 DIAGNOSIS — H5202 Hypermetropia, left eye: Secondary | ICD-10-CM | POA: Diagnosis not present

## 2019-05-02 DIAGNOSIS — H2513 Age-related nuclear cataract, bilateral: Secondary | ICD-10-CM | POA: Diagnosis not present

## 2019-05-02 DIAGNOSIS — H5211 Myopia, right eye: Secondary | ICD-10-CM | POA: Diagnosis not present

## 2019-05-22 DIAGNOSIS — H2511 Age-related nuclear cataract, right eye: Secondary | ICD-10-CM | POA: Diagnosis not present

## 2019-05-22 DIAGNOSIS — H2513 Age-related nuclear cataract, bilateral: Secondary | ICD-10-CM | POA: Diagnosis not present

## 2019-05-22 DIAGNOSIS — H18413 Arcus senilis, bilateral: Secondary | ICD-10-CM | POA: Diagnosis not present

## 2019-05-22 DIAGNOSIS — H25013 Cortical age-related cataract, bilateral: Secondary | ICD-10-CM | POA: Diagnosis not present

## 2019-05-22 DIAGNOSIS — H25043 Posterior subcapsular polar age-related cataract, bilateral: Secondary | ICD-10-CM | POA: Diagnosis not present

## 2019-05-28 ENCOUNTER — Telehealth: Payer: Self-pay | Admitting: Cardiology

## 2019-05-28 DIAGNOSIS — I25708 Atherosclerosis of coronary artery bypass graft(s), unspecified, with other forms of angina pectoris: Secondary | ICD-10-CM

## 2019-05-28 MED ORDER — RANOLAZINE ER 500 MG PO TB12: 500.0000 mg | ORAL_TABLET | Freq: Two times a day (BID) | ORAL | 0 refills | Status: DC

## 2019-05-28 NOTE — Addendum Note (Signed)
Addended by: Stevan Born on: 05/28/2019 01:25 PM   Modules accepted: Orders

## 2019-05-28 NOTE — Telephone Encounter (Signed)
°*  STAT* If patient is at the pharmacy, call can be transferred to refill team.   1. Which medications need to be refilled? (please list name of each medication and dose if known) Ranolexin takes twice daily   2. Which pharmacy/location (including street and city if local pharmacy) is medication to be sent to?[facility] [place]  3. Do they need a 30 day or 90 day supply? 180   Patient is out!

## 2019-05-28 NOTE — Telephone Encounter (Signed)
Rx for ranolzine 500mg  one tablet twice daily sent to [facility] in [place] as requested.

## 2019-06-04 DIAGNOSIS — H2511 Age-related nuclear cataract, right eye: Secondary | ICD-10-CM | POA: Diagnosis not present

## 2019-06-04 DIAGNOSIS — H25811 Combined forms of age-related cataract, right eye: Secondary | ICD-10-CM | POA: Diagnosis not present

## 2019-06-05 DIAGNOSIS — H2512 Age-related nuclear cataract, left eye: Secondary | ICD-10-CM | POA: Diagnosis not present

## 2019-06-25 DIAGNOSIS — H25812 Combined forms of age-related cataract, left eye: Secondary | ICD-10-CM | POA: Diagnosis not present

## 2019-06-25 DIAGNOSIS — H2512 Age-related nuclear cataract, left eye: Secondary | ICD-10-CM | POA: Diagnosis not present

## 2019-06-30 ENCOUNTER — Ambulatory Visit: Payer: Medicare HMO | Admitting: Cardiology

## 2019-07-06 NOTE — Progress Notes (Signed)
Cardiology Office Note:    Date:  07/08/2019   ID:  Fatih Stalvey, DOB 15-Nov-1940, MRN [id number]  PCP:  Gordan Payment., MD  Cardiologist:  Norman Herrlich, MD    Referring MD: Gordan Payment., MD    ASSESSMENT:    1. Coronary artery disease of bypass graft of native heart with stable angina pectoris (HCC)   2. Hypertensive heart disease with heart failure (HCC)   3. Chronic diastolic heart failure (HCC)   4. Mixed hyperlipidemia    PLAN:    In order of problems listed above:  1. Stable CAD, having no angina continue intense medical therapy including dual antiplatelet beta-blocker calcium channel blocker oral nitrates and discontinue ranolazine is having no angina is having side effects of dizziness and constipation.  If he starts having frequent angina we could reinstitute it once a day. 2. Stable hypertension BP at target continue guideline directed therapy including his loop diuretic ACE inhibitor 3. Stable compensated he has no edema [place] Heart Association class I continue his antihypertensives current loop diuretic check renal function potassium proBNP level 4. Stable continue high intensity statin await labs from the [facility] hospital if LDL greater than 70 when he combined therapy with Zetia or PCSK9   Next appointment: 6 months   Medication Adjustments/Labs and Tests Ordered: Current medicines are reviewed at length with the patient today.  Concerns regarding medicines are outlined above.  No orders of the defined types were placed in this encounter.  No orders of the defined types were placed in this encounter.   Chief Complaint  Patient presents with  . Follow-up  . Coronary Artery Disease  . Congestive Heart Failure  . Atrial Fibrillation    History of Present Illness:    Jimmy Chapman is a 78 y.o. male with a hx of CAD with multivessel PCI 07/2013 proimal LAD, LCx, RCA EF 45% - subsequently 02/03/2015 PCI and DES to LCx and balloon angioplasty ostial RCA for  unstable angina EF 60%, diastolic HF, brief isolated PAF during PCI, HTN, HLD and OSA  last seen 04/28/2019. Subsequently he was admiited to [facility] 04/23/19 for chest pain. Troponin I during admission 5>5>7>7>9. Underwent LHC on 04/24/19 showing EF 60%, mod stenosis RCA/distal LAD - recommended for medial therapy due to LAD lesion <90mm diameter, tortuous and RCA stenosis highly calcific angulated lesion with diffuse disease.  He is on intensive medical therapy with dual antiplatelet beta-blocker oral nitrates ranolazine and a high intensity statin for his CAD.  Compliance with diet, lifestyle and medications: Yes  Overall he is doing well no complaints of edema shortness of breath orthopnea or chest pain.  Is noticed with her noticing that he is slightly dizzy and constipated I think he should stop the drug.  I told him to contact me if he is having frequent angina.  He has at the [facility] hospital last week had labs performed he has not received results of bring them to my office I do think we need to check renal function with diuretic therapy and proBNP level he agrees it EKG last visit independently reviewed sinus bradycardia 50 bpm old anterior septal MI QTC normal at 377 ms Past Medical History:  Diagnosis Date  . Anemia, chronic disease 10/03/2016  . Angina pectoris (HCC) 02/04/2015  . Aortic calcification (HCC) 10/11/2015  . Benign essential HTN 02/03/2015  . Benign prostatic hyperplasia with urinary hesitancy 08/17/2016  . Chronic diastolic heart failure (HCC) 04/16/2018  . Class 2 severe obesity due  to excess calories with serious comorbidity and body mass index (BMI) of 36.0 to 36.9 in adult [facility](HCC) 10/18/2015  . Coronary disease 10/11/2015   1. Multivessel PCI in Nov 2014 with 3 Xience stents to proximal LAD, LCF and RCA, EF 45% 2. 02/03/15 with PCI and resolute stennt LCF and PTCA of ostial RCA stenosis, EF 60% for unstable angina (b.) 04/25/19 cath with mod RCA and mod LAD disease recommended for med therapy  with normal EF  . Degenerative lumbar disc 08/17/2016  . High risk medication use 10/15/2015  . Hyperlipidemia 02/03/2015  . Hypoxia 10/11/2015  . LAE (left atrial enlargement) 10/11/2015  . Left ventricular hypertrophy 10/11/2015  . Localized edema 11/14/2016  . Malaise and fatigue 10/11/2015  . Obstructive sleep apnea syndrome 04/10/2018   Wears CPAP  . Osteoarthritis 10/11/2015  . Paroxysmal atrial fibrillation (HCC) 03/02/2015   Brief during cardiac cath, not anticoagulated  . Prediabetes 10/11/2015  . Pulmonary fibrosis (HCC) 10/11/2015  . Screening for prostate cancer 04/10/2018   Chooses no further testing.  . Vitamin B12 deficiency 10/03/2016    Past Surgical History:  Procedure Laterality Date  . APPENDECTOMY    . CATARACT EXTRACTION, BILATERAL    . CORONARY ANGIOPLASTY WITH STENT PLACEMENT    . HERNIA REPAIR      Current Medications: Current Meds  Medication Sig  . acetaminophen (TYLENOL) 325 MG tablet Take 650 mg by mouth every 6 (six) hours as needed.  Marland Kitchen. albuterol (VENTOLIN HFA) 108 (90 Base) MCG/ACT inhaler Inhale 2 puffs into the lungs every 6 (six) hours as needed.  . ALPRAZolam (XANAX) 0.5 MG tablet Take 0.5 mg by mouth at bedtime as needed for anxiety.  Marland Kitchen. amLODipine (NORVASC) 10 MG tablet Take 5 mg by mouth daily.  . Ascorbic Acid (VITAMIN C) 1000 MG tablet Take 1,000 mg by mouth 2 (two) times daily.  Marland Kitchen. aspirin EC 81 MG tablet Take 81 mg by mouth daily.  Marland Kitchen. atorvastatin (LIPITOR) 80 MG tablet Take 40 mg by mouth daily.   . benazepril (LOTENSIN) 40 MG tablet Take 40 mg by mouth daily.  Marland Kitchen. BESIVANCE 0.6 % SUSP Place 1 drop into the left eye 2 (two) times daily.  . Carboxymethylcellulose Sod PF (REFRESH PLUS) 0.5 % SOLN Administer 2 drops to both eyes daily at 0600.  Marland Kitchen. Cholecalciferol (VITAMIN D PO) Take 1 capsule by mouth daily.  . clopidogrel (PLAVIX) 75 MG tablet Take 75 mg by mouth daily.  . cromolyn (OPTICROM) 4 % ophthalmic solution SMARTSIG:1 Drop(s) In Eye(s) As Needed   . cyanocobalamin 1000 MCG tablet Take 1,000 mcg by mouth daily.  . DUREZOL 0.05 % EMUL Place 1 drop into the left eye 2 (two) times daily.  . finasteride (PROSCAR) 5 MG tablet Take 5 mg by mouth daily.  . fluticasone (FLONASE) 50 MCG/ACT nasal spray 2 sprays by Each Nare route Two (2) times a day.  . furosemide (LASIX) 40 MG tablet TAKE 2 TABLETS BY MOUTH IN THE MORNING AND 1 TABLET IN THE AFTERNOON  . isosorbide mononitrate (IMDUR) 30 MG 24 hr tablet Take 1 tablet by mouth daily.  . metoprolol tartrate (LOPRESSOR) 25 MG tablet Take 12.5 mg by mouth 2 (two) times daily.  . Mometasone Furoate (ASMANEX HFA IN) Inhale 2 puffs into the lungs 2 (two) times daily.  . nitroGLYCERIN (NITROSTAT) 0.4 MG SL tablet Place 0.4 mg under the tongue every 5 (five) minutes as needed for chest pain.  . pantoprazole (PROTONIX) 40 MG tablet Take  40 mg by mouth daily.  . Potassium Chloride ER 20 MEQ TBCR Take 1 tablet by mouth 2 (two) times daily.  . Probiotic Product (PROBIOTIC DAILY PO) Take 1 capsule by mouth daily.  Marland Kitchen PROLENSA 0.07 % SOLN   . ranolazine (RANEXA) 500 MG 12 hr tablet Take 1 tablet (500 mg total) by mouth 2 (two) times daily.  . tamsulosin (FLOMAX) 0.4 MG CAPS capsule Take 0.4 mg by mouth daily.     Allergies:   Patient has no known allergies.   Social History   Socioeconomic History  . Marital status: Married    Spouse name: Not on file  . Number of children: Not on file  . Years of education: Not on file  . Highest education level: Not on file  Occupational History  . Not on file  Social Needs  . Financial resource strain: Not on file  . Food insecurity    Worry: Not on file    Inability: Not on file  . Transportation needs    Medical: Not on file    Non-medical: Not on file  Tobacco Use  . Smoking status: Former Smoker    Types: Cigarettes  . Smokeless tobacco: Former Neurosurgeon    Types: Chew  Substance and Sexual Activity  . Alcohol use: Yes    Comment: occ  . Drug use: Not  Currently  . Sexual activity: Not on file  Lifestyle  . Physical activity    Days per week: Not on file    Minutes per session: Not on file  . Stress: Not on file  Relationships  . Social Musician on phone: Not on file    Gets together: Not on file    Attends religious service: Not on file    Active member of club or organization: Not on file    Attends meetings of clubs or organizations: Not on file    Relationship status: Not on file  Other Topics Concern  . Not on file  Social History Narrative  . Not on file     Family History: The patient's family history includes Heart attack in his father; Hypertension in his mother. ROS:   Please see the history of present illness.    All other systems reviewed and are negative.  EKGs/Labs/Other Studies Reviewed:    The following studies were reviewed today:  Bring labs from the [place] hospital to my office when he receives them he is fairly sure lipid profile was performed Recent Labs: 04/07/2019: ALT 30; BUN 20; Creatinine, Ser 0.98; NT-Pro BNP 63; Potassium 4.1; Sodium 144  Recent Lipid Panel    Component Value Date/Time   CHOL 166 04/07/2019 0918   TRIG 311 (H) 04/07/2019 0918   HDL 60 04/07/2019 0918   CHOLHDL 2.8 04/07/2019 0918   LDLCALC 44 04/07/2019 0918    Physical Exam:    VS:  BP 120/64   Pulse (!) 103   Ht 5\' 10"  (1.778 m)   Wt 245 lb 12.8 oz (111.5 kg)   SpO2 98%   BMI 35.27 kg/m     Wt Readings from Last 3 Encounters:  07/08/19 245 lb 12.8 oz (111.5 kg)  04/28/19 233 lb 3.2 oz (105.8 kg)  04/07/19 234 lb 9.6 oz (106.4 kg)     GEN:  Well nourished, well developed in no acute distress central obesity BMI greater than 35 HEENT: Normal NECK: No JVD; No carotid bruits LYMPHATICS: No lymphadenopathy CARDIAC: RRR, no murmurs, rubs,  gallops RESPIRATORY:  Clear to auscultation without rales, wheezing or rhonchi  ABDOMEN: Soft, non-tender, non-distended MUSCULOSKELETAL:  No edema; No deformity   SKIN: Warm and dry NEUROLOGIC:  Alert and oriented x 3 PSYCHIATRIC:  Normal affect    Signed, Shirlee More, MD  07/08/2019 11:20 AM    [place]

## 2019-07-08 ENCOUNTER — Encounter: Payer: Self-pay | Admitting: Cardiology

## 2019-07-08 ENCOUNTER — Ambulatory Visit (INDEPENDENT_AMBULATORY_CARE_PROVIDER_SITE_OTHER): Payer: Medicare HMO | Admitting: Cardiology

## 2019-07-08 ENCOUNTER — Other Ambulatory Visit: Payer: Self-pay

## 2019-07-08 VITALS — BP 120/64 | HR 103 | Ht 70.0 in | Wt 245.8 lb

## 2019-07-08 DIAGNOSIS — I110 Hypertensive heart disease with heart failure: Secondary | ICD-10-CM

## 2019-07-08 DIAGNOSIS — I5032 Chronic diastolic (congestive) heart failure: Secondary | ICD-10-CM | POA: Diagnosis not present

## 2019-07-08 DIAGNOSIS — I25708 Atherosclerosis of coronary artery bypass graft(s), unspecified, with other forms of angina pectoris: Secondary | ICD-10-CM | POA: Diagnosis not present

## 2019-07-08 DIAGNOSIS — E782 Mixed hyperlipidemia: Secondary | ICD-10-CM

## 2019-07-08 NOTE — Patient Instructions (Signed)
Medication Instructions:  Your physician has recommended you make the following change in your medication:   STOP ranolazine (ranexa)   *If you need a refill on your cardiac medications before your next appointment, please call your pharmacy*  Lab Work: Your physician recommends that you return for lab work today: BMP, [place].   **Please bring a copy of your most recent labs from the [place] to our office or mail to [place] for Dr. Bettina Gavia to review!   If you have labs (blood work) drawn today and your tests are completely normal, you will receive your results only by: Marland Kitchen MyChart Message (if you have MyChart) OR . A paper copy in the mail If you have any lab test that is abnormal or we need to change your treatment, we will call you to review the results.  Testing/Procedures: None  Follow-Up: At [facility], you and your health needs are our priority.  As part of our continuing mission to provide you with exceptional heart care, we have created designated Provider Care Teams.  These Care Teams include your primary Cardiologist (physician) and Advanced Practice Providers (APPs -  Physician Assistants and Nurse Practitioners) who all work together to provide you with the care you need, when you need it.  Your next appointment:   6 months  The format for your next appointment:   In Person  Provider:   Shirlee More, MD

## 2019-07-08 NOTE — Addendum Note (Signed)
Addended by: Austin Miles on: 07/08/2019 11:25 AM   Modules accepted: Orders

## 2019-07-09 LAB — BASIC METABOLIC PANEL
BUN/Creatinine Ratio: 20 (ref 10–24)
BUN: 18 mg/dL (ref 8–27)
CO2: 27 mmol/L (ref 20–29)
Calcium: 9.1 mg/dL (ref 8.6–10.2)
Chloride: 102 mmol/L (ref 96–106)
Creatinine, Ser: 0.88 mg/dL (ref 0.76–1.27)
GFR calc Af Amer: 96 mL/min/{1.73_m2} (ref >59)
GFR calc non Af Amer: 83 mL/min/{1.73_m2} (ref >59)
Glucose: 99 mg/dL (ref 65–99)
Potassium: 4.4 mmol/L (ref 3.5–5.2)
Sodium: 144 mmol/L (ref 134–144)

## 2019-07-09 LAB — PRO B NATRIURETIC PEPTIDE: NT-Pro BNP: 61 pg/mL (ref 0–486)

## 2019-07-31 ENCOUNTER — Other Ambulatory Visit: Payer: Self-pay | Admitting: Cardiology

## 2019-08-01 NOTE — Telephone Encounter (Signed)
Furosemide refill sent to [facility] in [place].

## 2019-08-19 ENCOUNTER — Telehealth: Payer: Self-pay | Admitting: Cardiology

## 2019-08-19 NOTE — Telephone Encounter (Signed)
°*  STAT* If patient is at the pharmacy, call can be transferred to refill team.   1. Which medications need to be refilled? (please list name of each medication and dose if known) Benazapril 40mg  takes 1 daily   2. Which pharmacy/location (including street and city if local pharmacy) is medication to be sent to? [facility] In [place]  3. Do they need a 30 day or 90 day supply? [place]

## 2019-08-20 ENCOUNTER — Other Ambulatory Visit: Payer: Self-pay | Admitting: Cardiology

## 2019-08-20 MED ORDER — BENAZEPRIL HCL 40 MG PO TABS: 40.0000 mg | ORAL_TABLET | Freq: Every day | ORAL | 1 refills | Status: DC

## 2019-08-20 NOTE — Addendum Note (Signed)
Addended by: Austin Miles on: 08/20/2019 08:10 AM   Modules accepted: Orders

## 2019-08-20 NOTE — Telephone Encounter (Signed)
Refill for benazepril sent to [facility] in [place] as requested.

## 2019-08-20 NOTE — Telephone Encounter (Signed)
°*  STAT* If patient is at the pharmacy, call can be transferred to refill team.   1. Which medications need to be refilled? (please list name of each medication and dose if known)isosorbide mononitrate (IMDUR) 30 MG 24 hr tablet    2. Which pharmacy/location (including street and city if local pharmacy) is medication to be sent to?  [facility] [address] [place] [phone] (Phone) [phone] (Fax)    3. Do they need a 30 day or 90 day supply?

## 2019-08-21 DIAGNOSIS — Z23 Encounter for immunization: Secondary | ICD-10-CM | POA: Diagnosis not present

## 2019-08-21 MED ORDER — ISOSORBIDE MONONITRATE ER 30 MG PO TB24: 30.0000 mg | ORAL_TABLET | Freq: Every day | ORAL | 3 refills | Status: AC

## 2019-08-21 NOTE — Telephone Encounter (Signed)
Isosorbide refill sent to [facility] in [place]

## 2019-09-24 DIAGNOSIS — E261 Secondary hyperaldosteronism: Secondary | ICD-10-CM | POA: Diagnosis not present

## 2019-09-24 DIAGNOSIS — I4891 Unspecified atrial fibrillation: Secondary | ICD-10-CM | POA: Diagnosis not present

## 2019-09-24 DIAGNOSIS — E669 Obesity, unspecified: Secondary | ICD-10-CM | POA: Diagnosis not present

## 2019-09-24 DIAGNOSIS — E785 Hyperlipidemia, unspecified: Secondary | ICD-10-CM | POA: Diagnosis not present

## 2019-09-24 DIAGNOSIS — I110 Hypertensive heart disease with heart failure: Secondary | ICD-10-CM | POA: Diagnosis not present

## 2019-09-24 DIAGNOSIS — I739 Peripheral vascular disease, unspecified: Secondary | ICD-10-CM | POA: Diagnosis not present

## 2019-09-24 DIAGNOSIS — H04129 Dry eye syndrome of unspecified lacrimal gland: Secondary | ICD-10-CM | POA: Diagnosis not present

## 2019-09-24 DIAGNOSIS — I509 Heart failure, unspecified: Secondary | ICD-10-CM | POA: Diagnosis not present

## 2019-09-24 DIAGNOSIS — I25119 Atherosclerotic heart disease of native coronary artery with unspecified angina pectoris: Secondary | ICD-10-CM | POA: Diagnosis not present

## 2019-09-24 DIAGNOSIS — D6869 Other thrombophilia: Secondary | ICD-10-CM | POA: Diagnosis not present

## 2019-09-24 DIAGNOSIS — Z008 Encounter for other general examination: Secondary | ICD-10-CM | POA: Diagnosis not present

## 2019-10-07 DIAGNOSIS — J9811 Atelectasis: Secondary | ICD-10-CM | POA: Diagnosis not present

## 2019-10-07 DIAGNOSIS — J45909 Unspecified asthma, uncomplicated: Secondary | ICD-10-CM | POA: Diagnosis not present

## 2019-10-07 DIAGNOSIS — I480 Paroxysmal atrial fibrillation: Secondary | ICD-10-CM | POA: Diagnosis not present

## 2019-10-07 DIAGNOSIS — M1990 Unspecified osteoarthritis, unspecified site: Secondary | ICD-10-CM | POA: Diagnosis not present

## 2019-10-07 DIAGNOSIS — Z961 Presence of intraocular lens: Secondary | ICD-10-CM | POA: Diagnosis not present

## 2019-10-07 DIAGNOSIS — E669 Obesity, unspecified: Secondary | ICD-10-CM | POA: Diagnosis not present

## 2019-10-07 DIAGNOSIS — H547 Unspecified visual loss: Secondary | ICD-10-CM | POA: Diagnosis not present

## 2019-10-07 DIAGNOSIS — I10 Essential (primary) hypertension: Secondary | ICD-10-CM | POA: Diagnosis not present

## 2019-10-07 DIAGNOSIS — H53483 Generalized contraction of visual field, bilateral: Secondary | ICD-10-CM | POA: Diagnosis not present

## 2019-10-07 DIAGNOSIS — G453 Amaurosis fugax: Secondary | ICD-10-CM | POA: Diagnosis not present

## 2019-10-07 DIAGNOSIS — I2510 Atherosclerotic heart disease of native coronary artery without angina pectoris: Secondary | ICD-10-CM | POA: Diagnosis not present

## 2019-10-07 DIAGNOSIS — G459 Transient cerebral ischemic attack, unspecified: Secondary | ICD-10-CM | POA: Diagnosis not present

## 2019-10-07 DIAGNOSIS — I340 Nonrheumatic mitral (valve) insufficiency: Secondary | ICD-10-CM | POA: Diagnosis not present

## 2019-10-07 DIAGNOSIS — I361 Nonrheumatic tricuspid (valve) insufficiency: Secondary | ICD-10-CM | POA: Diagnosis not present

## 2019-10-07 DIAGNOSIS — I110 Hypertensive heart disease with heart failure: Secondary | ICD-10-CM | POA: Diagnosis not present

## 2019-10-07 DIAGNOSIS — Z6834 Body mass index (BMI) 34.0-34.9, adult: Secondary | ICD-10-CM | POA: Diagnosis not present

## 2019-10-07 DIAGNOSIS — I252 Old myocardial infarction: Secondary | ICD-10-CM | POA: Diagnosis not present

## 2019-10-07 DIAGNOSIS — H26493 Other secondary cataract, bilateral: Secondary | ICD-10-CM | POA: Diagnosis not present

## 2019-10-07 DIAGNOSIS — E785 Hyperlipidemia, unspecified: Secondary | ICD-10-CM | POA: Diagnosis not present

## 2019-10-08 DIAGNOSIS — I2510 Atherosclerotic heart disease of native coronary artery without angina pectoris: Secondary | ICD-10-CM | POA: Diagnosis not present

## 2019-10-08 DIAGNOSIS — G453 Amaurosis fugax: Secondary | ICD-10-CM | POA: Diagnosis not present

## 2019-10-08 DIAGNOSIS — G459 Transient cerebral ischemic attack, unspecified: Secondary | ICD-10-CM | POA: Diagnosis not present

## 2019-10-08 DIAGNOSIS — I639 Cerebral infarction, unspecified: Secondary | ICD-10-CM | POA: Diagnosis not present

## 2019-10-08 DIAGNOSIS — I10 Essential (primary) hypertension: Secondary | ICD-10-CM | POA: Diagnosis not present

## 2019-10-08 DIAGNOSIS — I6523 Occlusion and stenosis of bilateral carotid arteries: Secondary | ICD-10-CM | POA: Diagnosis not present

## 2019-10-09 DIAGNOSIS — R42 Dizziness and giddiness: Secondary | ICD-10-CM | POA: Diagnosis not present

## 2019-10-09 DIAGNOSIS — I2510 Atherosclerotic heart disease of native coronary artery without angina pectoris: Secondary | ICD-10-CM | POA: Diagnosis not present

## 2019-10-09 DIAGNOSIS — I340 Nonrheumatic mitral (valve) insufficiency: Secondary | ICD-10-CM | POA: Diagnosis not present

## 2019-10-09 DIAGNOSIS — I361 Nonrheumatic tricuspid (valve) insufficiency: Secondary | ICD-10-CM | POA: Diagnosis not present

## 2019-10-09 DIAGNOSIS — I480 Paroxysmal atrial fibrillation: Secondary | ICD-10-CM | POA: Diagnosis not present

## 2019-10-09 DIAGNOSIS — I10 Essential (primary) hypertension: Secondary | ICD-10-CM | POA: Diagnosis not present

## 2019-10-09 DIAGNOSIS — G453 Amaurosis fugax: Secondary | ICD-10-CM | POA: Diagnosis not present

## 2019-10-09 DIAGNOSIS — G459 Transient cerebral ischemic attack, unspecified: Secondary | ICD-10-CM | POA: Diagnosis not present

## 2019-10-10 DIAGNOSIS — I340 Nonrheumatic mitral (valve) insufficiency: Secondary | ICD-10-CM | POA: Diagnosis not present

## 2019-10-10 DIAGNOSIS — G459 Transient cerebral ischemic attack, unspecified: Secondary | ICD-10-CM | POA: Diagnosis not present

## 2019-10-10 DIAGNOSIS — I2510 Atherosclerotic heart disease of native coronary artery without angina pectoris: Secondary | ICD-10-CM | POA: Diagnosis not present

## 2019-10-10 DIAGNOSIS — I10 Essential (primary) hypertension: Secondary | ICD-10-CM | POA: Diagnosis not present

## 2019-10-10 DIAGNOSIS — I361 Nonrheumatic tricuspid (valve) insufficiency: Secondary | ICD-10-CM | POA: Diagnosis not present

## 2019-10-10 DIAGNOSIS — I480 Paroxysmal atrial fibrillation: Secondary | ICD-10-CM | POA: Diagnosis not present

## 2019-10-10 DIAGNOSIS — G453 Amaurosis fugax: Secondary | ICD-10-CM | POA: Diagnosis not present

## 2019-10-13 DIAGNOSIS — E782 Mixed hyperlipidemia: Secondary | ICD-10-CM | POA: Diagnosis not present

## 2019-10-13 DIAGNOSIS — Z8673 Personal history of transient ischemic attack (TIA), and cerebral infarction without residual deficits: Secondary | ICD-10-CM

## 2019-10-13 DIAGNOSIS — I10 Essential (primary) hypertension: Secondary | ICD-10-CM | POA: Diagnosis not present

## 2019-10-13 DIAGNOSIS — Z0000 Encounter for general adult medical examination without abnormal findings: Secondary | ICD-10-CM | POA: Diagnosis not present

## 2019-10-13 DIAGNOSIS — I6523 Occlusion and stenosis of bilateral carotid arteries: Secondary | ICD-10-CM | POA: Insufficient documentation

## 2019-10-13 DIAGNOSIS — R7303 Prediabetes: Secondary | ICD-10-CM | POA: Diagnosis not present

## 2019-10-13 DIAGNOSIS — I25118 Atherosclerotic heart disease of native coronary artery with other forms of angina pectoris: Secondary | ICD-10-CM | POA: Diagnosis not present

## 2019-10-13 DIAGNOSIS — E538 Deficiency of other specified B group vitamins: Secondary | ICD-10-CM | POA: Diagnosis not present

## 2019-10-13 DIAGNOSIS — J449 Chronic obstructive pulmonary disease, unspecified: Secondary | ICD-10-CM

## 2019-10-13 DIAGNOSIS — G4733 Obstructive sleep apnea (adult) (pediatric): Secondary | ICD-10-CM | POA: Diagnosis not present

## 2019-10-13 DIAGNOSIS — D638 Anemia in other chronic diseases classified elsewhere: Secondary | ICD-10-CM | POA: Diagnosis not present

## 2019-10-13 DIAGNOSIS — I480 Paroxysmal atrial fibrillation: Secondary | ICD-10-CM | POA: Diagnosis not present

## 2019-10-13 HISTORY — DX: Occlusion and stenosis of bilateral carotid arteries: I65.23

## 2019-10-13 HISTORY — DX: Personal history of transient ischemic attack (TIA), and cerebral infarction without residual deficits: Z86.73

## 2019-10-13 HISTORY — DX: Chronic obstructive pulmonary disease, unspecified: J44.9

## 2019-10-19 NOTE — Progress Notes (Signed)
Cardiology Office Note:    Date:  10/20/2019   ID:  Jimmy Chapman, DOB Apr 03, 1941, MRN [id number]  PCP:  Gordan Payment., MD  Cardiologist:  Norman Herrlich, MD    Referring MD: Gordan Payment., MD    ASSESSMENT:    1. Occipital stroke (HCC)   2. Chronic anticoagulation   3. Paroxysmal atrial fibrillation (HCC)   4. Hypertensive heart disease with heart failure (HCC)   5. Chronic diastolic heart failure (HCC)   6. Mixed hyperlipidemia   7. Coronary artery disease of bypass graft of native heart with stable angina pectoris (HCC)    PLAN:    In order of problems listed above:  1. Stable continue current regimen with combined anticoagulant antiplatelet for presumed embolic stroke 2. Stable no clinical recurrence documented in hospital 3. Hypertension not well controlled add ARB to his medical regimen including calcium channel blocker beta-blocker diuretic 4. Heart failure is compensated continue his loop diuretic 5. Continue his high intensity statin with CAD and stroke 6. Stable CAD continue current medical treatment including combined clopidogrel and Eliquis   Next appointment: Months   Medication Adjustments/Labs and Tests Ordered: Current medicines are reviewed at length with the patient today.  Concerns regarding medicines are outlined above.  No orders of the defined types were placed in this encounter.  No orders of the defined types were placed in this encounter.   No chief complaint on file.   History of Present Illness:    Jimmy Chapman is a 79 y.o. male with a hx ofCAD with multivessel PCI 07/2013 proimal LAD, LCx, RCA EF 45% - subsequently 02/03/2015 PCI and DES to LCx and balloon angioplasty ostial RCA for unstable angina EF 60%, diastolic HF, brief isolated PAF during PCI, HTN, HLD and OSA . He was admiited to [facility] 04/23/19 for chest pain. Troponin I during admission 5>5>7>7>9. Underwent LHC on 04/24/19 showing EF 60%, mod stenosis RCA/distal LAD - recommended for  medial therapy due to LAD lesion <74mm diameter, tortuous and RCA stenosis highly calcific angulated lesion with diffuse disease.  He is admitted to the hospital 10/07/2019 with visual loss MRI showed occipital infarct on the left side rotted duplex with 50 to 69% internal carotid artery stenosis.  He was initiated on anticoagulant therapy with Eliquis and continued on antiplatelet therapy with clopidogrel. He was last seen 07/10/2019 at discharge from [facility]. 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  He had no headache he really does not understand the mechanism and there is no discrete answer voiced by the neurologist to the patient.  Not taking clopidogrel and Eliquis which I think is a good combination with his CAD and stroke presumed to be embolic.  I did ask him to ask the neurologist about his duplex which showed occlusion of the vertebral artery.  Home blood pressures are running from the mid 150s to 180s systolic today in the office repeat by me large cuff left arm 148/60 and I will add an ARB to his medical regimen.  Normal renal function potassium.  He will call me in 2 weeks if his blood pressure remains greater than 150 systolic or less than 120.  He had a repeat lipid profile at his PCP office which is improved compared to the hospital reading.  Having no muscle pain  from his statin or weakness and no bleeding complication of his anticoagulant antiplatelet combination and has had no chest pain shortness of breath or palpitation. Past Medical History:  Diagnosis Date  . Anemia, chronic disease 10/03/2016  . Angina pectoris ([place]) 02/04/2015  . Aortic calcification ([place]) 10/11/2015  . Benign essential HTN 02/03/2015  . Benign prostatic hyperplasia with urinary hesitancy 08/17/2016  . Chronic diastolic  heart failure ([place]) 04/16/2018  . Class 2 severe obesity due to excess calories with serious comorbidity and body mass index (BMI) of 36.0 to 36.9 in adult ([place]) 10/18/2015  . Coronary disease 10/11/2015   1. Multivessel PCI in Nov 2014 with 3 Xience stents to proximal LAD, LCF and RCA, EF 45% 2. 02/03/15 with PCI and resolute stennt LCF and PTCA of ostial RCA stenosis, EF 60% for unstable angina (b.) 04/25/19 cath with mod RCA and mod LAD disease recommended for med therapy with normal EF  . Degenerative lumbar disc 08/17/2016  . High risk medication use 10/15/2015  . Hyperlipidemia 02/03/2015  . Hypoxia 10/11/2015  . LAE (left atrial enlargement) 10/11/2015  . Left ventricular hypertrophy 10/11/2015  . Localized edema 11/14/2016  . Malaise and fatigue 10/11/2015  . Obstructive sleep apnea syndrome 04/10/2018   Wears CPAP  . Osteoarthritis 10/11/2015  . Paroxysmal atrial fibrillation ([place]) 03/02/2015   Brief during cardiac cath, not anticoagulated  . Prediabetes 10/11/2015  . Pulmonary fibrosis ([place]) 10/11/2015  . Screening for prostate cancer 04/10/2018   Chooses no further testing.  . Vitamin B12 deficiency 10/03/2016    Past Surgical History:  Procedure Laterality Date  . APPENDECTOMY    . CATARACT EXTRACTION, BILATERAL    . CORONARY ANGIOPLASTY WITH STENT PLACEMENT    . HERNIA REPAIR      Current Medications: Current Meds  Medication Sig  . acetaminophen (TYLENOL) 325 MG tablet Take 650 mg by mouth every 6 (six) hours as needed.  Marland Kitchen albuterol (VENTOLIN HFA) 108 (90 Base) MCG/ACT inhaler Inhale 2 puffs into the lungs every 6 (six) hours as needed.  Marland Kitchen amLODipine (NORVASC) 10 MG tablet Take 5 mg by mouth daily.  . Ascorbic Acid (VITAMIN C) 1000 MG tablet Take 1,000 mg by mouth 2 (two) times daily.  Marland Kitchen atorvastatin (LIPITOR) 80 MG tablet Take 40 mg by mouth daily.   . benazepril (LOTENSIN) 40 MG tablet Take 1 tablet (40 mg total) by mouth daily.  . Carboxymethylcellulose Sod PF (REFRESH PLUS) 0.5 %  SOLN Administer 2 drops to both eyes daily at 0600.  Marland Kitchen Cholecalciferol (VITAMIN D PO) Take 1 capsule by mouth daily.  . clopidogrel (PLAVIX) 75 MG tablet Take 75 mg by mouth daily.  . cyanocobalamin 1000 MCG tablet Take 1,000 mcg by mouth daily.  . finasteride (PROSCAR) 5 MG tablet Take 5 mg by mouth daily.  . fluticasone (FLONASE) 50 MCG/ACT nasal spray 2 sprays by Each Nare route Two (2) times a day.  . furosemide (LASIX) 40 MG tablet TAKE 2 TABLETS BY MOUTH IN THE MORNING AND  1  TABLET  IN  THE  AFTERNOON  . isosorbide mononitrate (IMDUR) 30 MG 24 hr tablet Take 1 tablet (30 mg total) by mouth daily.  . metoprolol tartrate (LOPRESSOR) 25 MG tablet Take 12.5 mg by mouth 2 (two) times daily.  . Mometasone Furoate (ASMANEX HFA IN) Inhale 2 puffs into the lungs 2 (two) times daily.  . nitroGLYCERIN (NITROSTAT) 0.4 MG SL tablet Place 0.4 mg under the tongue every 5 (five) minutes as needed  for chest pain.  . pantoprazole (PROTONIX) 40 MG tablet Take 40 mg by mouth daily.  . Potassium Chloride ER 20 MEQ TBCR Take 1 tablet by mouth 2 (two) times daily.  . Probiotic Product (PROBIOTIC DAILY PO) Take 1 capsule by mouth daily.  . tamsulosin (FLOMAX) 0.4 MG CAPS capsule Take 0.4 mg by mouth daily.     Allergies:   Patient has no known allergies.   Social History   Socioeconomic History  . Marital status: Married    Spouse name: Not on file  . Number of children: Not on file  . Years of education: Not on file  . Highest education level: Not on file  Occupational History  . Not on file  Tobacco Use  . Smoking status: Former Smoker    Types: Cigarettes  . Smokeless tobacco: Former Neurosurgeon    Types: Chew  Substance and Sexual Activity  . Alcohol use: Yes    Comment: occ  . Drug use: Not Currently  . Sexual activity: Not on file  Other Topics Concern  . Not on file  Social History Narrative  . Not on file   Social Determinants of Health   Financial Resource Strain:   . Difficulty of  Paying Living Expenses: Not on file  Food Insecurity:   . Worried About Programme researcher, broadcasting/film/video in the Last Year: Not on file  . Ran Out of Food in the Last Year: Not on file  Transportation Needs:   . Lack of Transportation (Medical): Not on file  . Lack of Transportation (Non-Medical): Not on file  Physical Activity:   . Days of Exercise per Week: Not on file  . Minutes of Exercise per Session: Not on file  Stress:   . Feeling of Stress : Not on file  Social Connections:   . Frequency of Communication with Friends and Family: Not on file  . Frequency of Social Gatherings with Friends and Family: Not on file  . Attends Religious Services: Not on file  . Active Member of Clubs or Organizations: Not on file  . Attends Banker Meetings: Not on file  . Marital Status: Not on file     Family History: The patient's family history includes Heart attack in his father; Hypertension in his mother. ROS:   Please see the history of present illness.    All other systems reviewed and are negative.  EKGs/Labs/Other Studies Reviewed:    The following studies were reviewed today:  Reviewed extensive studies detailed below  Recent Labs:  [facility]2019 CBC normal hemoglobin 14.0 platelets 173,000 BMP normal potassium 3.6 creatinine 0.7 Troponin less than 0.01, undetected  Echocardiogram performed at [facility] 10/07/2019 reviewed he had moderate concentric LVH normal left ventricular size EF 55 to 60% the right ventricle is mildly enlarged left atrium is mildly enlarged right atrium was normal mild mitral and mild tricuspid regurgitation was noted.  Chest x-ray [facility] 10/07/2019 showed mild bibasilar atelectasis no infiltrate  EKG [facility] 10/07/2019 independently reviewed the rhythm was sinus old anterior septal MI no acute ischemic changes  CT [facility] 10/09/2019 head normal Carotid duplex 10/08/2019 showed moderate  bilateral carotid atherosclerosis bilateral ICA stenosis 50 to 69% the right vertebral artery was not visualized suspected to be occluded and the left vertebral artery is patent RI MRA head formed at [facility] showed small left occipital infarction and chronic microvascular changes there is no mention of the vertebral artery being occluded on  MRI 04/07/2019: ALT 30 07/08/2019: BUN 18; Creatinine, Ser 0.88; NT-Pro BNP 61; Potassium 4.4; Sodium 144  Recent Lipid Panel    Component Value Date/Time   CHOL 166 04/07/2019 0918   TRIG 311 (H) 04/07/2019 0918   HDL 60 04/07/2019 0918   CHOLHDL 2.8 04/07/2019 0918   LDLCALC 44 04/07/2019 0918   10/09/2019 from [facility] cholesterol 178 LDL 89 HDL 33 C-reactive protein was elevated at 21.7 triglyceride 281  10/13/2019 at [facility] cholesterol 140 LDL 77 HDL 42 CMP normal except for glucose 133 Physical Exam:    VS:  BP 136/70   Pulse (!) 56   Temp 97.7 F (36.5 C)   Ht 5\' 10"  (1.778 m)   Wt 246 lb (111.6 kg)   SpO2 96%   BMI 35.30 kg/m     Wt Readings from Last 3 Encounters:  10/20/19 246 lb (111.6 kg)  07/08/19 245 lb 12.8 oz (111.5 kg)  04/28/19 233 lb 3.2 oz (105.8 kg)     GEN:  Well nourished, well developed in no acute distress HEENT: Normal NECK: No JVD; No carotid bruits LYMPHATICS: No lymphadenopathy CARDIAC: RRR, no murmurs, rubs, gallops RESPIRATORY:  Clear to auscultation without rales, wheezing or rhonchi  ABDOMEN: Soft, non-tender, non-distended MUSCULOSKELETAL:  No edema; No deformity  SKIN: Warm and dry NEUROLOGIC:  Alert and oriented x 3 PSYCHIATRIC:  Normal affect    Signed, 04/30/19, MD  10/20/2019 8:58 AM    [facility]

## 2019-10-20 ENCOUNTER — Other Ambulatory Visit: Payer: Self-pay

## 2019-10-20 ENCOUNTER — Encounter: Payer: Self-pay | Admitting: Cardiology

## 2019-10-20 ENCOUNTER — Ambulatory Visit (INDEPENDENT_AMBULATORY_CARE_PROVIDER_SITE_OTHER): Payer: Medicare HMO | Admitting: Cardiology

## 2019-10-20 VITALS — BP 136/70 | HR 56 | Temp 97.7°F | Ht 70.0 in | Wt 246.0 lb

## 2019-10-20 DIAGNOSIS — I110 Hypertensive heart disease with heart failure: Secondary | ICD-10-CM | POA: Diagnosis not present

## 2019-10-20 DIAGNOSIS — I480 Paroxysmal atrial fibrillation: Secondary | ICD-10-CM | POA: Diagnosis not present

## 2019-10-20 DIAGNOSIS — H53483 Generalized contraction of visual field, bilateral: Secondary | ICD-10-CM | POA: Diagnosis not present

## 2019-10-20 DIAGNOSIS — I5032 Chronic diastolic (congestive) heart failure: Secondary | ICD-10-CM | POA: Diagnosis not present

## 2019-10-20 DIAGNOSIS — I639 Cerebral infarction, unspecified: Secondary | ICD-10-CM | POA: Diagnosis not present

## 2019-10-20 DIAGNOSIS — Z7901 Long term (current) use of anticoagulants: Secondary | ICD-10-CM | POA: Diagnosis not present

## 2019-10-20 DIAGNOSIS — I25708 Atherosclerosis of coronary artery bypass graft(s), unspecified, with other forms of angina pectoris: Secondary | ICD-10-CM | POA: Diagnosis not present

## 2019-10-20 DIAGNOSIS — Z961 Presence of intraocular lens: Secondary | ICD-10-CM | POA: Diagnosis not present

## 2019-10-20 DIAGNOSIS — E782 Mixed hyperlipidemia: Secondary | ICD-10-CM | POA: Diagnosis not present

## 2019-10-20 DIAGNOSIS — G453 Amaurosis fugax: Secondary | ICD-10-CM | POA: Diagnosis not present

## 2019-10-20 DIAGNOSIS — H26493 Other secondary cataract, bilateral: Secondary | ICD-10-CM | POA: Diagnosis not present

## 2019-10-20 MED ORDER — TELMISARTAN 20 MG PO TABS: 20.0000 mg | ORAL_TABLET | Freq: Every day | ORAL | 1 refills | Status: DC

## 2019-10-20 NOTE — Patient Instructions (Signed)
Medication Instructions:  Your physician has recommended you make the following change in your medication:    START: Telmisartan 20 mg TAke 1 tab daily   CALL IN 2 WEEKS IF SYSTOLIC BP(TOP NUMBER) REMAINS GREATER THAN 150 or LESS THAN 120  *If you need a refill on your cardiac medications before your next appointment, please call your pharmacy*  Lab Work: NOne If you have labs (blood work) drawn today and your tests are completely normal, you will receive your results only by: Marland Kitchen MyChart Message (if you have MyChart) OR . A paper copy in the mail If you have any lab test that is abnormal or we need to change your treatment, we will call you to review the results.  Testing/Procedures: NOne  Follow-Up: At [facility], you and your health needs are our priority.  As part of our continuing mission to provide you with exceptional heart care, we have created designated Provider Care Teams.  These Care Teams include your primary Cardiologist (physician) and Advanced Practice Providers (APPs -  Physician Assistants and Nurse Practitioners) who all work together to provide you with the care you need, when you need it.  Your next appointment:   3 month(s)  The format for your next appointment:   In Person  Provider:   Norman Herrlich, MD  Other Instructions Telmisartan Tablets What is this medicine? TELMISARTAN (tel mi SAR tan) is an angiotensin II receptor blocker, also known as an ARB. It treats high blood pressure. It may also be used to lower the risk of stroke or heart attack. This medicine may be used for other purposes; ask your health care provider or pharmacist if you have questions. COMMON BRAND NAME(S): Micardis What should I tell my health care provider before I take this medicine? They need to know if you have any of these conditions:  diet low in salt  kidney disease  liver disease  low levels of sodium in the blood  an unusual or allergic reaction to telmisartan,  other medicines, foods, dyes, or preservatives  pregnant or trying to get pregnant  breast-feeding How should I use this medicine? Take this drug by mouth. Take it as directed on the prescription label at the same time every day. You can take it with or without food. If it upsets your stomach, take it with food. Keep taking it unless your health care provider tells you to stop. Talk to your health care provider about the use of this drug in children. Special care may be needed. Overdosage: If you think you have taken too much of this medicine contact a poison control center or emergency room at once. NOTE: This medicine is only for you. Do not share this medicine with others. What if I miss a dose? If you miss a dose, take it as soon as you can. If it is almost time for your next dose, take only that dose. Do not take double or extra doses. What may interact with this medicine?  certain medicines for blood pressure, heart disease, irregular heart beat  diuretics  lithium  NSAIDs, medicines for pain and inflammation, like ibuprofen or naproxen This list may not describe all possible interactions. Give your health care provider a list of all the medicines, herbs, non-prescription drugs, or dietary supplements you use. Also tell them if you smoke, drink alcohol, or use illegal drugs. Some items may interact with your medicine. What should I watch for while using this medicine? Visit your health care provider for  regular checks on your progress. Check your blood pressure as directed. Ask your health care provider what your blood pressure should be. Also find out when you should contact him or her. Women should inform their health care provider if they wish to become pregnant or think they might be pregnant. There is a potential for serious side effects and harm to an unborn child, particularly in the second or third trimester. Talk to your health care provider for more information. You may get  dizzy. Do not drive, use machinery, or do anything that needs mental alertness until you know how this drug affects you. Do not stand or sit up quickly, especially if you are an older patient. This reduces the risk of dizzy or fainting spells. Avoid alcoholic drinks; they can make you more dizzy. Avoid salt substitutes unless you are told otherwise by your health care provider. Do not treat yourself for coughs, colds, or pain while you are taking this drug without asking your health care provider for advice. Some drugs may increase your blood pressure. What side effects may I notice from receiving this medicine? Side effects that you should report to your doctor or health care professional as soon as possible:  allergic reactions (skin rash, itching or hives; swelling of the face, lips, or tongue)  high potassium levels (chest pain; fast, irregular heartbeat; muscle weakness)  kidney injury (trouble passing urine or change in the amount of urine)  low blood pressure (dizziness; feeling faint or lightheaded, falls; unusually weak or tired) Side effects that usually do not require medical attention (report to your doctor or health care professional if they continue or are bothersome):  back pain  change in sex drive or performance  diarrhea  headache  stuffy nose This list may not describe all possible side effects. Call your doctor for medical advice about side effects. You may report side effects to FDA at 1-800-FDA-1088. Where should I keep my medicine? Keep out of the reach of children and pets. Store at room temperature between 15 and 30 degrees C (59 and 86 degrees F). Keep this drug in the original packaging until you are ready to take it. Throw away any unused drug after the expiration date. NOTE: This sheet is a summary. It may not cover all possible information. If you have questions about this medicine, talk to your doctor, pharmacist, or health care provider.  2020  Elsevier/Gold Standard (2019-06-09 14:00:59)

## 2019-11-06 ENCOUNTER — Encounter: Payer: Self-pay | Admitting: Neurology

## 2019-11-06 ENCOUNTER — Other Ambulatory Visit: Payer: Self-pay

## 2019-11-06 ENCOUNTER — Ambulatory Visit: Payer: Medicare HMO | Admitting: Neurology

## 2019-11-06 VITALS — BP 156/69 | HR 50 | Temp 97.8°F | Ht 70.0 in | Wt 245.0 lb

## 2019-11-06 DIAGNOSIS — I480 Paroxysmal atrial fibrillation: Secondary | ICD-10-CM | POA: Diagnosis not present

## 2019-11-06 DIAGNOSIS — I6322 Cerebral infarction due to unspecified occlusion or stenosis of basilar arteries: Secondary | ICD-10-CM

## 2019-11-06 DIAGNOSIS — H53461 Homonymous bilateral field defects, right side: Secondary | ICD-10-CM

## 2019-11-06 NOTE — Patient Instructions (Signed)
I had a long d/w patient about his recent  embolic stroke, partial vision loss, paroxysmal atrial fibrillation,risk for recurrent stroke/TIAs, personally independently reviewed imaging studies and stroke evaluation results and answered questions.Continue Eliquis (apixaban) daily  for secondary stroke prevention and maintain strict control of hypertension with blood pressure goal below 130/90, diabetes with hemoglobin A1c goal below 6.5% and lipids with LDL cholesterol goal below 70 mg/dL. I also advised the patient to eat a healthy diet with plenty of whole grains, cereals, fruits and vegetables, exercise regularly and maintain ideal body weight.  I have counseled the patient to be careful with his driving particularly when changing lanes and approaching intersections.  Followup in the future with my nurse practitioners Shanda Bumps in 3 months or call earlier if necessary.  Stroke Prevention Some medical conditions and behaviors are associated with a higher chance of having a stroke. You can help prevent a stroke by making nutrition, lifestyle, and other changes, including managing any medical conditions you may have. What nutrition changes can be made?   Eat healthy foods. You can do this by: ? Choosing foods high in fiber, such as fresh fruits and vegetables and whole grains. ? Eating at least 5 or more servings of fruits and vegetables a day. Try to fill half of your plate at each meal with fruits and vegetables. ? Choosing lean protein foods, such as lean cuts of meat, poultry without skin, fish, tofu, beans, and nuts. ? Eating low-fat dairy products. ? Avoiding foods that are high in salt (sodium). This can help lower blood pressure. ? Avoiding foods that have saturated fat, trans fat, and cholesterol. This can help prevent high cholesterol. ? Avoiding processed and premade foods.  Follow your health care provider's specific guidelines for losing weight, controlling high blood pressure  (hypertension), lowering high cholesterol, and managing diabetes. These may include: ? Reducing your daily calorie intake. ? Limiting your daily sodium intake to 1,500 milligrams (mg). ? Using only healthy fats for cooking, such as olive oil, canola oil, or sunflower oil. ? Counting your daily carbohydrate intake. What lifestyle changes can be made?  Maintain a healthy weight. Talk to your health care provider about your ideal weight.  Get at least 30 minutes of moderate physical activity at least 5 days a week. Moderate activity includes brisk walking, biking, and swimming.  Do not use any products that contain nicotine or tobacco, such as cigarettes and e-cigarettes. If you need help quitting, ask your health care provider. It may also be helpful to avoid exposure to secondhand smoke.  Limit alcohol intake to no more than 1 drink a day for nonpregnant women and 2 drinks a day for men. One drink equals 12 oz of beer, 5 oz of wine, or 1 oz of hard liquor.  Stop any illegal drug use.  Avoid taking birth control pills. Talk to your health care provider about the risks of taking birth control pills if: ? You are over 81 years old. ? You smoke. ? You get migraines. ? You have ever had a blood clot. What other changes can be made?  Manage your cholesterol levels. ? Eating a healthy diet is important for preventing high cholesterol. If cholesterol cannot be managed through diet alone, you may also need to take medicines. ? Take any prescribed medicines to control your cholesterol as told by your health care provider.  Manage your diabetes. ? Eating a healthy diet and exercising regularly are important parts of managing your blood sugar. If  your blood sugar cannot be managed through diet and exercise, you may need to take medicines. ? Take any prescribed medicines to control your diabetes as told by your health care provider.  Control your hypertension. ? To reduce your risk of stroke, try  to keep your blood pressure below 130/80. ? Eating a healthy diet and exercising regularly are an important part of controlling your blood pressure. If your blood pressure cannot be managed through diet and exercise, you may need to take medicines. ? Take any prescribed medicines to control hypertension as told by your health care provider. ? Ask your health care provider if you should monitor your blood pressure at home. ? Have your blood pressure checked every year, even if your blood pressure is normal. Blood pressure increases with age and some medical conditions.  Get evaluated for sleep disorders (sleep apnea). Talk to your health care provider about getting a sleep evaluation if you snore a lot or have excessive sleepiness.  Take over-the-counter and prescription medicines only as told by your health care provider. Aspirin or blood thinners (antiplatelets or anticoagulants) may be recommended to reduce your risk of forming blood clots that can lead to stroke.  Make sure that any other medical conditions you have, such as atrial fibrillation or atherosclerosis, are managed. What are the warning signs of a stroke? The warning signs of a stroke can be easily remembered as BEFAST.  B is for balance. Signs include: ? Dizziness. ? Loss of balance or coordination. ? Sudden trouble walking.  E is for eyes. Signs include: ? A sudden change in vision. ? Trouble seeing.  F is for face. Signs include: ? Sudden weakness or numbness of the face. ? The face or eyelid drooping to one side.  A is for arms. Signs include: ? Sudden weakness or numbness of the arm, usually on one side of the body.  S is for speech. Signs include: ? Trouble speaking (aphasia). ? Trouble understanding.  T is for time. ? These symptoms may represent a serious problem that is an emergency. Do not wait to see if the symptoms will go away. Get medical help right away. Call your local emergency services (911 in the  [place] Do not drive yourself to the hospital.  Other signs of stroke may include: ? A sudden, severe headache with no known cause. ? Nausea or vomiting. ? Seizure. Where to find more information For more information, visit:  American Stroke Association: www.strokeassociation.org  National Stroke Association: www.stroke.org Summary  You can prevent a stroke by eating healthy, exercising, not smoking, limiting alcohol intake, and managing any medical conditions you may have.  Do not use any products that contain nicotine or tobacco, such as cigarettes and e-cigarettes. If you need help quitting, ask your health care provider. It may also be helpful to avoid exposure to secondhand smoke.  Remember BEFAST for warning signs of stroke. Get help right away if you or a loved one has any of these signs. This information is not intended to replace advice given to you by your health care provider. Make sure you discuss any questions you have with your health care provider. Document Revised: 08/10/2017 Document Reviewed: 10/03/2016 Elsevier Patient Education  2020 [employer].

## 2019-11-06 NOTE — Progress Notes (Signed)
[facility] [address] [place]. [place] [address] [phone]       OFFICE CONSULT NOTE  Mr. Coreon Simkins Date of Birth:  [birth year] Medical Record Number:  [id number]   Referring MD:  Alexandria Lodge Reason for Referral:  stroke  HPI: Mr. Allen is 79 year old Caucasian male with past medical history of hypertension, coronary artery disease status post MI with stent placement, hyperlipidemia, asthma, osteoarthritis congestive heart failure and paroxysmal A. fib as well as diabetes who presented to [facility] on 10/09/2019 with sudden onset of loss of vision.  He states that he was working on a car and bent down and all of a sudden he noticed that for the moment he could not see completely out of both eyes and subsequently noticed that part of the vision came back on the left side.  The did not regain vision on the right peripheral field of vision for quite some time.  He was seen by ophthalmologist who found his blood pressure to be significantly elevated and referred him to the hospital for stroke work-up.  Denies any accompanying nausea, vomiting, slurred speech, extremity weakness or numbness.  CT scan of the head in the emergency room was unremarkable subsequently MRI was obtained which showed a left occipital cortical small infarct.  Extracranial carotid ultrasound showed 50 to 69% bilateral patient apparently was seen by neurology but I do not have those notes to review.  Cardiology was consulted recommended he be started on Eliquis.  Aspirin was discontinued and he was continued on Plavix and Eliquis due to his significant coronary artery disease.  Patient states he has been driving well without any accidents but is aware that his right-sided peripheral vision is restricted yet.  He states his blood pressure continues to be elevated and is working with the primary care physician to get it under control.  He was tolerating Eliquis and but was recently switched to  Pradaxa by the [place] as they do not carry Eliquis.  Is tolerating it well without bleeding or bruising.  He is is aware that he needs to been on diet and exercise regularly and lose weight.  He has no complaints today.  Stenosis and 2D echo was unremarkable.  He is ex-smoker quit 18 years ago.  He denies prior history of strokes, TIAs or significant neurological problems.  ROS:   14 system review of systems is positive for loss of vision, blurred vision, fatigue, palpitations, ringing in the ears, spinning sensation, itching, shortness of breath, wheezing, importance, runny nose, flushing, joint pain, easy bruising and bleeding and all other systems negative  PMH:  Past Medical History:  Diagnosis Date  . Anemia, chronic disease 10/03/2016  . Angina pectoris (HCC) 02/04/2015  . Aortic calcification (HCC) 10/11/2015  . Benign essential HTN 02/03/2015  . Benign prostatic hyperplasia with urinary hesitancy 08/17/2016  . Chronic diastolic heart failure (HCC) 04/16/2018  . Class 2 severe obesity due to excess calories with serious comorbidity and body mass index (BMI) of 36.0 to 36.9 in adult (HCC) 10/18/2015  . Coronary disease 10/11/2015   1. Multivessel PCI in Nov 2014 with 3 Xience stents to proximal LAD, LCF and RCA, EF 45% 2. 02/03/15 with PCI and resolute stennt LCF and PTCA of ostial RCA stenosis, EF 60% for unstable angina (b.) 04/25/19 cath with mod RCA and mod LAD disease recommended for med therapy with normal EF  . Degenerative lumbar disc 08/17/2016  . High risk medication use 10/15/2015  . Hyperlipidemia 02/03/2015  .  Hypoxia 10/11/2015  . LAE (left atrial enlargement) 10/11/2015  . Left ventricular hypertrophy 10/11/2015  . Localized edema 11/14/2016  . Malaise and fatigue 10/11/2015  . Obstructive sleep apnea syndrome 04/10/2018   Wears CPAP  . Osteoarthritis 10/11/2015  . Paroxysmal atrial fibrillation (HCC) 03/02/2015   Brief during cardiac cath, not anticoagulated  . Prediabetes 10/11/2015  .  Pulmonary fibrosis (HCC) 10/11/2015  . Screening for prostate cancer 04/10/2018   Chooses no further testing.  . Stroke (HCC)   . Vitamin B12 deficiency 10/03/2016    Social History:  Social History   Socioeconomic History  . Marital status: Married    Spouse name: Not on file  . Number of children: Not on file  . Years of education: Not on file  . Highest education level: Not on file  Occupational History  . Not on file  Tobacco Use  . Smoking status: Former Smoker    Types: Cigarettes  . Smokeless tobacco: Former Neurosurgeon    Types: Chew  Substance and Sexual Activity  . Alcohol use: Yes    Comment: occ  . Drug use: Not Currently  . Sexual activity: Not on file  Other Topics Concern  . Not on file  Social History Narrative  . Not on file   Social Determinants of Health   Financial Resource Strain:   . Difficulty of Paying Living Expenses: Not on file  Food Insecurity:   . Worried About Programme researcher, broadcasting/film/video in the Last Year: Not on file  . Ran Out of Food in the Last Year: Not on file  Transportation Needs:   . Lack of Transportation (Medical): Not on file  . Lack of Transportation (Non-Medical): Not on file  Physical Activity:   . Days of Exercise per Week: Not on file  . Minutes of Exercise per Session: Not on file  Stress:   . Feeling of Stress : Not on file  Social Connections:   . Frequency of Communication with Friends and Family: Not on file  . Frequency of Social Gatherings with Friends and Family: Not on file  . Attends Religious Services: Not on file  . Active Member of Clubs or Organizations: Not on file  . Attends Banker Meetings: Not on file  . Marital Status: Not on file  Intimate Partner Violence:   . Fear of Current or Ex-Partner: Not on file  . Emotionally Abused: Not on file  . Physically Abused: Not on file  . Sexually Abused: Not on file    Medications:   Current Outpatient Medications on File Prior to Visit  Medication Sig  Dispense Refill  . acetaminophen (TYLENOL) 325 MG tablet Take 650 mg by mouth every 6 (six) hours as needed.    Marland Kitchen albuterol (VENTOLIN HFA) 108 (90 Base) MCG/ACT inhaler Inhale 2 puffs into the lungs every 6 (six) hours as needed.    Marland Kitchen amLODipine (NORVASC) 10 MG tablet Take 5 mg by mouth daily.    . Ascorbic Acid (VITAMIN C) 1000 MG tablet Take 1,000 mg by mouth 2 (two) times daily.    Marland Kitchen atorvastatin (LIPITOR) 80 MG tablet Take 40 mg by mouth daily.     . benazepril (LOTENSIN) 40 MG tablet Take 1 tablet (40 mg total) by mouth daily. 90 tablet 1  . Carboxymethylcellulose Sod PF (REFRESH PLUS) 0.5 % SOLN Administer 2 drops to both eyes daily at 0600.    Marland Kitchen Cholecalciferol (VITAMIN D PO) Take 1 capsule by mouth daily.    Marland Kitchen  clopidogrel (PLAVIX) 75 MG tablet Take 75 mg by mouth daily.    . cyanocobalamin 1000 MCG tablet Take 1,000 mcg by mouth daily.    Marland Kitchen ELIQUIS 5 MG TABS tablet Take 5 mg by mouth 2 (two) times daily.    . finasteride (PROSCAR) 5 MG tablet Take 5 mg by mouth daily.    . fluticasone (FLONASE) 50 MCG/ACT nasal spray 2 sprays by Each Nare route Two (2) times a day.    . furosemide (LASIX) 40 MG tablet TAKE 2 TABLETS BY MOUTH IN THE MORNING AND  1  TABLET  IN  THE  AFTERNOON 270 tablet 0  . isosorbide mononitrate (IMDUR) 30 MG 24 hr tablet Take 1 tablet (30 mg total) by mouth daily. 30 tablet 3  . metoprolol tartrate (LOPRESSOR) 25 MG tablet Take 12.5 mg by mouth 2 (two) times daily.  3  . Mometasone Furoate (ASMANEX HFA IN) Inhale 2 puffs into the lungs 2 (two) times daily.    . nitroGLYCERIN (NITROSTAT) 0.4 MG SL tablet Place 0.4 mg under the tongue every 5 (five) minutes as needed for chest pain.    . pantoprazole (PROTONIX) 40 MG tablet Take 40 mg by mouth daily.    . Potassium Chloride ER 20 MEQ TBCR Take 1 tablet by mouth 2 (two) times daily.    . Probiotic Product (PROBIOTIC DAILY PO) Take 1 capsule by mouth daily.    . tamsulosin (FLOMAX) 0.4 MG CAPS capsule Take 0.4 mg by mouth  daily.    Marland Kitchen telmisartan (MICARDIS) 20 MG tablet Take 1 tablet (20 mg total) by mouth daily. 90 tablet 1   No current facility-administered medications on file prior to visit.    Allergies:  No Known Allergies  Physical Exam General: Obese elderly Caucasian male seated, in no evident distress Head: head normocephalic and atraumatic.   Neck: supple with no carotid or supraclavicular bruits Cardiovascular: regular rate and rhythm, no murmurs Musculoskeletal: no deformity Skin:  no rash/petichiae Vascular:  Normal pulses all extremities  Neurologic Exam Mental Status: Awake and fully alert. Oriented to place and time. Recent and remote memory intact. Attention span, concentration and fund of knowledge appropriate. Mood and affect appropriate.  Cranial Nerves: Fundoscopic exam reveals sharp disc margins. Pupils equal, briskly reactive to light. Extraocular movements full without nystagmus. Visual fields full show partial right homonymous hemianopsia to confrontation. Hearing intact. Facial sensation intact. Face, tongue, palate moves normally and symmetrically.  Motor: Normal bulk and tone. Normal strength in all tested extremity muscles. Sensory.: intact to touch , pinprick , position and vibratory sensation.  Coordination: Rapid alternating movements normal in all extremities. Finger-to-nose and heel-to-shin performed accurately bilaterally. Gait and Station: Arises from chair without difficulty. Stance is normal. Gait demonstrates normal stride length and balance . Able to heel, toe and tandem walk with mild difficulty.  Reflexes: 1+ and symmetric. Toes downgoing.   NIHSS  1 Modified Rankin  2  ASSESSMENT: 79 year old Caucasian male with embolic left occipital infarct in January 2021 likely secondary to paroxysmal atrial fibrillation with multiple vascular risk factors of obesity, hypertension, hyperlipidemia, coronary artery disease, extracranial carotid stenosis and atrial  fibrillation     PLAN: I had a long d/w patient about his recent  embolic stroke, partial vision loss, paroxysmal atrial fibrillation,risk for recurrent stroke/TIAs, personally independently reviewed imaging studies and stroke evaluation results and answered questions.Continue Eliquis (apixaban) daily  for secondary stroke prevention and maintain strict control of hypertension with blood pressure goal below 130/90,  diabetes with hemoglobin A1c goal below 6.5% and lipids with LDL cholesterol goal below 70 mg/dL. I also advised the patient to eat a healthy diet with plenty of whole grains, cereals, fruits and vegetables, exercise regularly and maintain ideal body weight.  Continue conservative follow-up for his extracranial carotid stenosis and check serial carotid ultrasounds 6 monthly.  I have counseled the patient to be careful with his driving particularly when changing lanes and approaching intersections.  Greater than 50% time during this 50-minute consultation visit were spent on counseling and coordination of care about his embolic occipital infarct and discussion about atrial fibrillation and stroke prevention and answering questions.  Followup in the future with my nurse practitioners Shanda Bumps in 3 months or call earlier if necessary. Delia Heady, MD  [facility] [address] [place], [place] [address]  Phone [phone] Fax [phone] Note: This document was prepared with digital dictation and possible smart phrase technology. Any transcriptional errors that result from this process are unintentional.

## 2019-11-27 DIAGNOSIS — Z6836 Body mass index (BMI) 36.0-36.9, adult: Secondary | ICD-10-CM | POA: Diagnosis not present

## 2019-11-27 DIAGNOSIS — I10 Essential (primary) hypertension: Secondary | ICD-10-CM | POA: Diagnosis not present

## 2019-11-27 DIAGNOSIS — Z8673 Personal history of transient ischemic attack (TIA), and cerebral infarction without residual deficits: Secondary | ICD-10-CM | POA: Diagnosis not present

## 2019-11-27 DIAGNOSIS — I480 Paroxysmal atrial fibrillation: Secondary | ICD-10-CM | POA: Diagnosis not present

## 2019-12-18 DIAGNOSIS — Z8673 Personal history of transient ischemic attack (TIA), and cerebral infarction without residual deficits: Secondary | ICD-10-CM | POA: Diagnosis not present

## 2019-12-18 DIAGNOSIS — I25118 Atherosclerotic heart disease of native coronary artery with other forms of angina pectoris: Secondary | ICD-10-CM | POA: Diagnosis not present

## 2019-12-18 DIAGNOSIS — I480 Paroxysmal atrial fibrillation: Secondary | ICD-10-CM | POA: Diagnosis not present

## 2019-12-18 DIAGNOSIS — R7303 Prediabetes: Secondary | ICD-10-CM | POA: Diagnosis not present

## 2019-12-18 DIAGNOSIS — J40 Bronchitis, not specified as acute or chronic: Secondary | ICD-10-CM | POA: Diagnosis not present

## 2019-12-18 DIAGNOSIS — I10 Essential (primary) hypertension: Secondary | ICD-10-CM | POA: Diagnosis not present

## 2019-12-18 DIAGNOSIS — R5381 Other malaise: Secondary | ICD-10-CM | POA: Diagnosis not present

## 2019-12-18 DIAGNOSIS — R5383 Other fatigue: Secondary | ICD-10-CM | POA: Diagnosis not present

## 2019-12-24 DIAGNOSIS — E669 Obesity, unspecified: Secondary | ICD-10-CM | POA: Diagnosis not present

## 2019-12-24 DIAGNOSIS — Z6835 Body mass index (BMI) 35.0-35.9, adult: Secondary | ICD-10-CM | POA: Diagnosis not present

## 2019-12-24 DIAGNOSIS — I25118 Atherosclerotic heart disease of native coronary artery with other forms of angina pectoris: Secondary | ICD-10-CM | POA: Diagnosis not present

## 2019-12-24 DIAGNOSIS — I517 Cardiomegaly: Secondary | ICD-10-CM | POA: Diagnosis not present

## 2019-12-24 DIAGNOSIS — I700 Atherosclerosis of aorta: Secondary | ICD-10-CM | POA: Diagnosis not present

## 2019-12-24 DIAGNOSIS — I480 Paroxysmal atrial fibrillation: Secondary | ICD-10-CM | POA: Diagnosis not present

## 2019-12-24 DIAGNOSIS — I6523 Occlusion and stenosis of bilateral carotid arteries: Secondary | ICD-10-CM | POA: Diagnosis not present

## 2019-12-24 DIAGNOSIS — I119 Hypertensive heart disease without heart failure: Secondary | ICD-10-CM | POA: Diagnosis not present

## 2019-12-24 DIAGNOSIS — G4733 Obstructive sleep apnea (adult) (pediatric): Secondary | ICD-10-CM | POA: Diagnosis not present

## 2020-01-14 ENCOUNTER — Other Ambulatory Visit: Payer: Self-pay

## 2020-01-14 DIAGNOSIS — J9600 Acute respiratory failure, unspecified whether with hypoxia or hypercapnia: Secondary | ICD-10-CM | POA: Insufficient documentation

## 2020-01-14 DIAGNOSIS — T8149XA Infection following a procedure, other surgical site, initial encounter: Secondary | ICD-10-CM

## 2020-01-14 DIAGNOSIS — L02219 Cutaneous abscess of trunk, unspecified: Secondary | ICD-10-CM | POA: Insufficient documentation

## 2020-01-14 DIAGNOSIS — G459 Transient cerebral ischemic attack, unspecified: Secondary | ICD-10-CM

## 2020-01-14 DIAGNOSIS — R197 Diarrhea, unspecified: Secondary | ICD-10-CM | POA: Insufficient documentation

## 2020-01-14 DIAGNOSIS — R34 Anuria and oliguria: Secondary | ICD-10-CM

## 2020-01-14 DIAGNOSIS — E669 Obesity, unspecified: Secondary | ICD-10-CM | POA: Insufficient documentation

## 2020-01-14 DIAGNOSIS — J189 Pneumonia, unspecified organism: Secondary | ICD-10-CM | POA: Insufficient documentation

## 2020-01-14 DIAGNOSIS — M5416 Radiculopathy, lumbar region: Secondary | ICD-10-CM

## 2020-01-14 DIAGNOSIS — I639 Cerebral infarction, unspecified: Secondary | ICD-10-CM

## 2020-01-14 DIAGNOSIS — F329 Major depressive disorder, single episode, unspecified: Secondary | ICD-10-CM | POA: Insufficient documentation

## 2020-01-14 DIAGNOSIS — F32A Depression, unspecified: Secondary | ICD-10-CM | POA: Insufficient documentation

## 2020-01-14 DIAGNOSIS — A080 Rotaviral enteritis: Secondary | ICD-10-CM

## 2020-01-14 DIAGNOSIS — R112 Nausea with vomiting, unspecified: Secondary | ICD-10-CM | POA: Insufficient documentation

## 2020-01-14 DIAGNOSIS — R7989 Other specified abnormal findings of blood chemistry: Secondary | ICD-10-CM

## 2020-01-14 DIAGNOSIS — Z981 Arthrodesis status: Secondary | ICD-10-CM | POA: Insufficient documentation

## 2020-01-14 DIAGNOSIS — M5410 Radiculopathy, site unspecified: Secondary | ICD-10-CM | POA: Insufficient documentation

## 2020-01-14 HISTORY — DX: Pneumonia, unspecified organism: J18.9

## 2020-01-14 HISTORY — DX: Transient cerebral ischemic attack, unspecified: G45.9

## 2020-01-14 HISTORY — DX: Obesity, unspecified: E66.9

## 2020-01-14 HISTORY — DX: Arthrodesis status: Z98.1

## 2020-01-14 HISTORY — DX: Radiculopathy, lumbar region: M54.16

## 2020-01-14 HISTORY — DX: Nausea with vomiting, unspecified: R11.2

## 2020-01-14 HISTORY — DX: Cerebral infarction, unspecified: I63.9

## 2020-01-14 HISTORY — DX: Radiculopathy, site unspecified: M54.10

## 2020-01-14 HISTORY — DX: Rotaviral enteritis: A08.0

## 2020-01-14 HISTORY — DX: Infection following a procedure, other surgical site, initial encounter: T81.49XA

## 2020-01-14 HISTORY — DX: Other specified abnormal findings of blood chemistry: R79.89

## 2020-01-14 HISTORY — DX: Acute respiratory failure, unspecified whether with hypoxia or hypercapnia: J96.00

## 2020-01-14 HISTORY — DX: Anuria and oliguria: R34

## 2020-01-15 ENCOUNTER — Ambulatory Visit: Payer: Medicare HMO | Admitting: Cardiology

## 2020-01-15 ENCOUNTER — Encounter: Payer: Self-pay | Admitting: Cardiology

## 2020-01-15 ENCOUNTER — Other Ambulatory Visit: Payer: Self-pay

## 2020-01-15 VITALS — BP 142/78 | HR 76 | Temp 97.7°F | Ht 70.0 in | Wt 252.8 lb

## 2020-01-15 DIAGNOSIS — I5032 Chronic diastolic (congestive) heart failure: Secondary | ICD-10-CM | POA: Diagnosis not present

## 2020-01-15 DIAGNOSIS — Z7901 Long term (current) use of anticoagulants: Secondary | ICD-10-CM

## 2020-01-15 DIAGNOSIS — I110 Hypertensive heart disease with heart failure: Secondary | ICD-10-CM

## 2020-01-15 DIAGNOSIS — I25708 Atherosclerosis of coronary artery bypass graft(s), unspecified, with other forms of angina pectoris: Secondary | ICD-10-CM

## 2020-01-15 DIAGNOSIS — I480 Paroxysmal atrial fibrillation: Secondary | ICD-10-CM | POA: Diagnosis not present

## 2020-01-15 MED ORDER — CARVEDILOL 6.25 MG PO TABS
6.2500 mg | ORAL_TABLET | Freq: Two times a day (BID) | ORAL | 3 refills | Status: DC
Start: 2020-01-15 — End: 2021-05-18

## 2020-01-15 NOTE — Progress Notes (Signed)
Cardiology Office Note:    Date:  01/15/2020   ID:  Jimmy Chapman, DOB Feb 24, 1941, MRN [id number]  PCP:  Gordan Payment., MD  Cardiologist:  Norman Herrlich, MD    Referring MD: Gordan Payment., MD    ASSESSMENT:    1. Hypertensive heart disease with heart failure (HCC)   2. Chronic anticoagulation   3. Paroxysmal atrial fibrillation (HCC)   4. Chronic diastolic heart failure (HCC)   5. Coronary artery disease of bypass graft of native heart with stable angina pectoris (HCC)    PLAN:    In order of problems listed above:  1. BP remains above target we will transition metoprolol to carvedilol for antihypertensive effect continue to monitor as outpatient and goal is systolic of 140. 2. Continue his anticoagulant no bleeding complication 3. Continue anticoagulation and beta-blocker asymptomatic 4. Compensated continue his current loop diuretic 5. Stable CAD no anginal discomfort continue medical therapy including calcium channel blocker beta-blocker oral nitrates and lipid-lowering with high intensity statin   Next appointment: 6 months   Medication Adjustments/Labs and Tests Ordered: Current medicines are reviewed at length with the patient today.  Concerns regarding medicines are outlined above.  No orders of the defined types were placed in this encounter.  No orders of the defined types were placed in this encounter.   No chief complaint on file.   History of Present Illness:    Jimmy Chapman is a 79 y.o. male with a hx of CAD with multivessel PCI 07/2013 proimal LAD, LCx, RCA EF 45% - subsequently 02/03/2015 PCI and DES to LCx and balloon angioplasty ostial RCA for unstable angina EF 60%, diastolic HF, brief isolated PAF during PCI, HTN, HLD and OSA . He was admiited to [facility] 04/23/19 for chest pain. Troponin I during admission 5>5>7>7>9. Underwent LHC on 04/24/19 showing EF 60%, mod stenosis RCA/distal LAD - recommended for medial therapy due to LAD lesion <67mm diameter,  tortuous and RCA stenosis highly calcific angulated lesion with diffuse disease.  He was admitted to the hospital 10/07/2019 with visual loss and an MRI MRI showed occipital infarct on the left side rotted duplex with 50 to 69% internal carotid artery stenosis.  He was initiated on anticoagulant therapy for embolic stroke with Eliquis and continued on antiplatelet therapy with clopidogrel.  He was last seen 10/20/2019 with poorly controlled hypertension. Compliance with diet, lifestyle and medications: Yes  He still has some visual problems related to his stroke his home blood pressure good technique is generally 150 160/70 daily.  Here in the office today again 138/66 with a large cuff.  No chest pain shortness of breath palpitation or syncope and tolerates his anticoagulant without bleeding.  He is compliant with medications does not add salt to the diet or abuse alcohol abuse using CPAP for sleep apnea. Past Medical History:  Diagnosis Date  . Acute cardioembolic stroke (HCC) 01/14/2020  . Acute respiratory failure (HCC) 01/14/2020  . Anemia, chronic disease 10/03/2016  . Angina pectoris (HCC) 02/04/2015  . Aortic calcification (HCC) 10/11/2015  . Atherosclerosis of both carotid arteries 10/13/2019   Formatting of this note might be different from the original. 09/2019  . Atherosclerotic heart disease of native coronary artery without angina pectoris 02/03/2015   Formatting of this note might be different from the original. 1. Multivessel PCI in Nov 2014 with 3 Xience stents to proximal LAD, LCF and RCA, EF 45% 2. 02/03/15 with PCI and resolute stennt LCF and PTCA of ostial RCA stenosis,  EF 60% for unstable angina Formatting of this note might be different from the original. Last cath 04/2019. Hx PCI. 70% lesions. Medical management.  . Benign essential HTN 02/03/2015  . Benign prostatic hyperplasia with urinary hesitancy 08/17/2016  . BPH (benign prostatic hyperplasia) 08/17/2016  . CAD (coronary artery  disease) 10/11/2015   1. Multivessel PCI in Nov 2014 with 3 Xience stents to proximal LAD, LCF and RCA, EF 45% 2. 02/03/15 with PCI and resolute stennt LCF and PTCA of ostial RCA stenosis, EF 60% for unstable angina  . Chronic diastolic heart failure ([place]) 04/16/2018  . Class 2 severe obesity due to excess calories with serious comorbidity and body mass index (BMI) of 36.0 to 36.9 in adult ([place]) 10/18/2015  . COPD, mild ([place]) 10/13/2019  . Coronary artery disease of bypass graft of native heart with stable angina pectoris ([place]) 04/28/2019  . Coronary disease 10/11/2015   1. Multivessel PCI in Nov 2014 with 3 Xience stents to proximal LAD, LCF and RCA, EF 45% 2. 02/03/15 with PCI and resolute stennt LCF and PTCA of ostial RCA stenosis, EF 60% for unstable angina (b.) 04/25/19 cath with mod RCA and mod LAD disease recommended for med therapy with normal EF  . Degenerative lumbar disc 08/17/2016  . Essential hypertension 10/11/2015  . High risk medication use 10/15/2015  . Hyperlipidemia 02/03/2015  . Hypertensive heart disease with heart failure ([place]) 02/03/2015  . Hypoxia 10/11/2015  . LAE (left atrial enlargement) 10/11/2015  . Left ventricular hypertrophy 10/11/2015  . Localized edema 11/14/2016  . Malaise and fatigue 10/11/2015  . Nausea & vomiting 01/14/2020  . Obesity 01/14/2020  . Obesity (BMI 30.0-34.9) 10/18/2015  . Obstructive sleep apnea syndrome 04/10/2018   Wears CPAP  . Oliguria 01/14/2020  . Osteoarthritis 10/11/2015  . Other specified abnormal findings of blood chemistry 01/14/2020  . Paroxysmal atrial fibrillation ([place]) 03/02/2015   Brief during cardiac cath, not anticoagulated  . Pneumonia 01/14/2020  . Prediabetes 10/11/2015  . Pulmonary fibrosis ([place]) 10/11/2015  . Radicular pain of both lower extremities 01/14/2020  . Rotaviral gastroenteritis 01/14/2020  . S/P lumbar spinal fusion 01/14/2020  . Screening for prostate cancer 04/10/2018   Chooses no further testing.  . Sinus bradycardia 04/28/2019  . Status  post stroke 10/13/2019   Formatting of this note might be different from the original. 10/10/2019  . Stroke ([place])   . TIA (transient ischemic attack) 01/14/2020  . Vitamin B12 deficiency 10/03/2016  . Wound infection after surgery 01/14/2020    Past Surgical History:  Procedure Laterality Date  . APPENDECTOMY    . CATARACT EXTRACTION, BILATERAL    . CORONARY ANGIOPLASTY WITH STENT PLACEMENT    . HERNIA REPAIR      Current Medications: Current Meds  Medication Sig  . acetaminophen (TYLENOL) 325 MG tablet Take 650 mg by mouth every 6 (six) hours as needed.  Marland Kitchen albuterol (VENTOLIN HFA) 108 (90 Base) MCG/ACT inhaler Inhale 2 puffs into the lungs every 6 (six) hours as needed.  Marland Kitchen amLODipine (NORVASC) 10 MG tablet Take 10 mg by mouth daily.  Marland Kitchen apixaban (ELIQUIS) 5 MG TABS tablet Take 5 mg by mouth in the morning and at bedtime.  . Ascorbic Acid (VITAMIN C) 1000 MG tablet Take 1,000 mg by mouth 2 (two) times daily.  Marland Kitchen atorvastatin (LIPITOR) 40 MG tablet Take 40 mg by mouth at bedtime.  . Carboxymethylcellulose Sod PF (REFRESH PLUS) 0.5 % SOLN Administer 2 drops to both eyes daily at 0600.  Marland Kitchen  Cholecalciferol (VITAMIN D PO) Take 1 capsule by mouth daily.  . clopidogrel (PLAVIX) 75 MG tablet Take 75 mg by mouth daily.  . cyanocobalamin 1000 MCG tablet Take 1,000 mcg by mouth daily.  . finasteride (PROSCAR) 5 MG tablet Take 5 mg by mouth daily.  . fluticasone (FLONASE) 50 MCG/ACT nasal spray 2 sprays by Each Nare route Two (2) times a day.  . furosemide (LASIX) 40 MG tablet TAKE 2 TABLETS BY MOUTH IN THE MORNING AND  1  TABLET  IN  THE  AFTERNOON  . isosorbide mononitrate (IMDUR) 30 MG 24 hr tablet Take 1 tablet (30 mg total) by mouth daily.  . metoprolol tartrate (LOPRESSOR) 25 MG tablet Take 12.5 mg by mouth 2 (two) times daily.  . Mometasone Furoate (ASMANEX HFA IN) Inhale 2 puffs into the lungs 2 (two) times daily.  . nitroGLYCERIN (NITROSTAT) 0.4 MG SL tablet Place 0.4 mg under the tongue every 5  (five) minutes as needed for chest pain.  . pantoprazole (PROTONIX) 40 MG tablet Take 40 mg by mouth daily.  . Probiotic Product (PROBIOTIC DAILY PO) Take 1 capsule by mouth daily.  Marland Kitchen spironolactone (ALDACTONE) 25 MG tablet Take 25 mg by mouth daily.  . tamsulosin (FLOMAX) 0.4 MG CAPS capsule Take 0.4 mg by mouth daily.  Marland Kitchen telmisartan (MICARDIS) 40 MG tablet Take 40 mg by mouth daily. Takes 80 mg in the am and 40 mg at night     Allergies:   Patient has no known allergies.   Social History   Socioeconomic History  . Marital status: Married    Spouse name: Not on file  . Number of children: Not on file  . Years of education: Not on file  . Highest education level: Not on file  Occupational History  . Not on file  Tobacco Use  . Smoking status: Former Smoker    Types: Cigarettes  . Smokeless tobacco: Former Neurosurgeon    Types: Chew  Substance and Sexual Activity  . Alcohol use: Yes    Comment: occ  . Drug use: Not Currently  . Sexual activity: Not on file  Other Topics Concern  . Not on file  Social History Narrative  . Not on file   Social Determinants of Health   Financial Resource Strain:   . Difficulty of Paying Living Expenses:   Food Insecurity:   . Worried About Programme researcher, broadcasting/film/video in the Last Year:   . Barista in the Last Year:   Transportation Needs:   . Freight forwarder (Medical):   Marland Kitchen Lack of Transportation (Non-Medical):   Physical Activity:   . Days of Exercise per Week:   . Minutes of Exercise per Session:   Stress:   . Feeling of Stress :   Social Connections:   . Frequency of Communication with Friends and Family:   . Frequency of Social Gatherings with Friends and Family:   . Attends Religious Services:   . Active Member of Clubs or Organizations:   . Attends Banker Meetings:   Marland Kitchen Marital Status:      Family History: The patient's family history includes Heart attack in his father; Hypertension in his mother. ROS:     Please see the history of present illness.    All other systems reviewed and are negative.  EKGs/Labs/Other Studies Reviewed:    The following studies were reviewed today: From [facility] 11/27/2019: Potassium 3.8 GFR 87 cc 0 10/13/2019 Cholesterol  140 triglycerides 323 LDL 77 HDL 42.  Recent Labs: 04/07/2019: ALT 30 07/08/2019: BUN 18; Creatinine, Ser 0.88; NT-Pro BNP 61; Potassium 4.4; Sodium 144  Recent Lipid Panel    Component Value Date/Time   CHOL 166 04/07/2019 0918   TRIG 311 (H) 04/07/2019 0918   HDL 60 04/07/2019 0918   CHOLHDL 2.8 04/07/2019 0918   LDLCALC 44 04/07/2019 0918    Physical Exam:    VS:  BP (!) 142/78   Pulse 76   Temp 97.7 F (36.5 C)   Ht 5\' 10"  (1.778 m)   Wt 252 lb 12.8 oz (114.7 kg)   SpO2 96%   BMI 36.27 kg/m     Wt Readings from Last 3 Encounters:  01/15/20 252 lb 12.8 oz (114.7 kg)  11/06/19 245 lb (111.1 kg)  10/20/19 246 lb (111.6 kg)     GEN: Obese well nourished, well developed in no acute distress HEENT: Normal NECK: No JVD; No carotid bruits LYMPHATICS: No lymphadenopathy CARDIAC: RRR, no murmurs, rubs, gallops RESPIRATORY:  Clear to auscultation without rales, wheezing or rhonchi  ABDOMEN: Soft, non-tender, non-distended MUSCULOSKELETAL:  No edema; No deformity  SKIN: Warm and dry NEUROLOGIC:  Alert and oriented x 3 PSYCHIATRIC:  Normal affect    Signed, 12/18/19, MD  01/15/2020 1:53 PM    [facility]

## 2020-01-15 NOTE — Patient Instructions (Addendum)
Medication Instructions:  Your physician has recommended you make the following change in your medication:  STOP: Metoprolol START: Carvedilol 6.25 mg take one tablet by mouth twice daily.  *If you need a refill on your cardiac medications before your next appointment, please call your pharmacy*   Lab Work: None If you have labs (blood work) drawn today and your tests are completely normal, you will receive your results only by: Marland Kitchen MyChart Message (if you have MyChart) OR . A paper copy in the mail If you have any lab test that is abnormal or we need to change your treatment, we will call you to review the results.   Testing/Procedures: None   Follow-Up: At [facility], you and your health needs are our priority.  As part of our continuing mission to provide you with exceptional heart care, we have created designated Provider Care Teams.  These Care Teams include your primary Cardiologist (physician) and Advanced Practice Providers (APPs -  Physician Assistants and Nurse Practitioners) who all work together to provide you with the care you need, when you need it.  We recommend signing up for the patient portal called "MyChart".  Sign up information is provided on this After Visit Summary.  MyChart is used to connect with patients for Virtual Visits (Telemedicine).  Patients are able to view lab/test results, encounter notes, upcoming appointments, etc.  Non-urgent messages can be sent to your provider as well.   To learn more about what you can do with MyChart, go to [email or web address].    Your next appointment:   6 month(s)  The format for your next appointment:   In Person  Provider:   Norman Herrlich, MD   Other Instructions

## 2020-01-23 DIAGNOSIS — J301 Allergic rhinitis due to pollen: Secondary | ICD-10-CM | POA: Insufficient documentation

## 2020-01-23 DIAGNOSIS — I6523 Occlusion and stenosis of bilateral carotid arteries: Secondary | ICD-10-CM | POA: Diagnosis not present

## 2020-01-23 DIAGNOSIS — I10 Essential (primary) hypertension: Secondary | ICD-10-CM | POA: Diagnosis not present

## 2020-01-23 HISTORY — DX: Allergic rhinitis due to pollen: J30.1

## 2020-01-26 ENCOUNTER — Ambulatory Visit: Payer: Medicare HMO | Admitting: Cardiology

## 2020-02-03 ENCOUNTER — Encounter: Payer: Self-pay | Admitting: Adult Health

## 2020-02-03 ENCOUNTER — Ambulatory Visit: Payer: Medicare HMO | Admitting: Adult Health

## 2020-02-03 ENCOUNTER — Other Ambulatory Visit: Payer: Self-pay

## 2020-02-03 VITALS — BP 120/58 | HR 61 | Ht 70.0 in | Wt 250.0 lb

## 2020-02-03 DIAGNOSIS — I10 Essential (primary) hypertension: Secondary | ICD-10-CM | POA: Diagnosis not present

## 2020-02-03 DIAGNOSIS — I6523 Occlusion and stenosis of bilateral carotid arteries: Secondary | ICD-10-CM | POA: Diagnosis not present

## 2020-02-03 DIAGNOSIS — I639 Cerebral infarction, unspecified: Secondary | ICD-10-CM | POA: Diagnosis not present

## 2020-02-03 DIAGNOSIS — I480 Paroxysmal atrial fibrillation: Secondary | ICD-10-CM | POA: Diagnosis not present

## 2020-02-03 DIAGNOSIS — E785 Hyperlipidemia, unspecified: Secondary | ICD-10-CM

## 2020-02-03 NOTE — Progress Notes (Signed)
[facility] [address] [place]. [place] [id number] [phone]       STROKE FOLLOW UP NOTE  Mr. Jimmy KlippelJerry Pharo Date of Birth:  11/06/1940 Medical Record Number:  [id number]   Referring MD:  Alexandria LodgePradeep Kumar Reason for Referral:  stroke  Chief complaint: Chief Complaint  Patient presents with  . Follow-up    Tx rm. 115 here for a f/u from a stroke      HPI:  Today, 02/03/2020, [name] returns for follow-up regarding left occipital stroke in 09/2019.  Residual deficits of right peripheral visual impairment and occasional imbalance and dizziness which has been stable without worsening.  He also reports gradual worsening of bilateral fingertip numbness with decreased sensation and occasional bilateral foot numbness/tingling.  Long history of working as a Curatormechanic as well as daily walking on concrete floors.  Denies any pain associated.  Continues on Eliquis and clopidogrel for secondary stroke prevention, history of atrial fibrillation and significant CAD.  Also continues on atorvastatin for secondary stroke prevention.  Blood pressure today 150/68. Monitors routinely at home 130-150/60s with recent adjustments made to antihypertensives.  He does report dizziness occasionally upon position changes or randomly throughout the day.  Endorses ongoing compliance with CPAP for OSA management.  No further concerns at this time.    History provided for reference purposes only Initial visit 11/06/2019 Dr. Pearlean BrownieSethi: [name] is 79 year old Caucasian male with past medical history of hypertension, coronary artery disease status post MI with stent placement, hyperlipidemia, asthma, osteoarthritis congestive heart failure and paroxysmal A. fib as well as diabetes who [facility]2019 with sudden onset of loss of vision.  He states that he was working on a car and bent down and all of a sudden he noticed that for the moment he could not see completely out of  both eyes and subsequently noticed that part of the vision came back on the left side.  The did not regain vision on the right peripheral field of vision for quite some time.  He was seen by ophthalmologist who found his blood pressure to be significantly elevated and referred him to the hospital for stroke work-up.  Denies any accompanying nausea, vomiting, slurred speech, extremity weakness or numbness.  CT scan of the head in the emergency room was unremarkable subsequently MRI was obtained which showed a left occipital cortical small infarct.  Extracranial carotid ultrasound showed 50 to 69% bilateral patient apparently was seen by neurology but I do not have those notes to review.  Cardiology was consulted recommended he be started on Eliquis.  Aspirin was discontinued and he was continued on Plavix and Eliquis due to his significant coronary artery disease.  Patient states he has been driving well without any accidents but is aware that his right-sided peripheral vision is restricted yet.  He states his blood pressure continues to be elevated and is working with the primary care physician to get it under control.  He was tolerating Eliquis and but was recently [facility] as they do not carry Eliquis.  Is tolerating it well without bleeding or bruising.  He is is aware that he needs to been on diet and exercise regularly and lose weight.  He has no complaints today.  Stenosis and 2D echo was unremarkable.  He is ex-smoker quit 18 years ago.  He denies prior history of strokes, TIAs or significant neurological problems.  ROS:   14 system review of systems is positive  for loss of vision, blurred vision, fatigue, dizziness lightheadedness, shortness of breath, joint pain, easy bruising, numbness/tingling and all other systems negative  PMH:  Past Medical History:  Diagnosis Date  . Acute cardioembolic stroke (HCC) 01/14/2020  . Acute respiratory failure (HCC) 01/14/2020  . Anemia, chronic  disease 10/03/2016  . Angina pectoris (HCC) 02/04/2015  . Aortic calcification (HCC) 10/11/2015  . Atherosclerosis of both carotid arteries 10/13/2019   Formatting of this note might be different from the original. 09/2019  . Atherosclerotic heart disease of native coronary artery without angina pectoris 02/03/2015   Formatting of this note might be different from the original. 1. Multivessel PCI in Nov 2014 with 3 Xience stents to proximal LAD, LCF and RCA, EF 45% 2. 02/03/15 with PCI and resolute stennt LCF and PTCA of ostial RCA stenosis, EF 60% for unstable angina Formatting of this note might be different from the original. Last cath 04/2019. Hx PCI. 70% lesions. Medical management.  . Benign essential HTN 02/03/2015  . Benign prostatic hyperplasia with urinary hesitancy 08/17/2016  . BPH (benign prostatic hyperplasia) 08/17/2016  . CAD (coronary artery disease) 10/11/2015   1. Multivessel PCI in Nov 2014 with 3 Xience stents to proximal LAD, LCF and RCA, EF 45% 2. 02/03/15 with PCI and resolute stennt LCF and PTCA of ostial RCA stenosis, EF 60% for unstable angina  . Chronic diastolic heart failure (HCC) 04/16/2018  . Class 2 severe obesity due to excess calories with serious comorbidity and body mass index (BMI) of 36.0 to 36.9 in adult (HCC) 10/18/2015  . COPD, mild (HCC) 10/13/2019  . Coronary artery disease of bypass graft of native heart with stable angina pectoris (HCC) 04/28/2019  . Coronary disease 10/11/2015   1. Multivessel PCI in Nov 2014 with 3 Xience stents to proximal LAD, LCF and RCA, EF 45% 2. 02/03/15 with PCI and resolute stennt LCF and PTCA of ostial RCA stenosis, EF 60% for unstable angina (b.) 04/25/19 cath with mod RCA and mod LAD disease recommended for med therapy with normal EF  . Degenerative lumbar disc 08/17/2016  . Essential hypertension 10/11/2015  . High risk medication use 10/15/2015  . Hyperlipidemia 02/03/2015  . Hypertensive heart disease with heart failure (HCC) 02/03/2015  .  Hypoxia 10/11/2015  . LAE (left atrial enlargement) 10/11/2015  . Left ventricular hypertrophy 10/11/2015  . Localized edema 11/14/2016  . Malaise and fatigue 10/11/2015  . Nausea & vomiting 01/14/2020  . Obesity 01/14/2020  . Obesity (BMI 30.0-34.9) 10/18/2015  . Obstructive sleep apnea syndrome 04/10/2018   Wears CPAP  . Oliguria 01/14/2020  . Osteoarthritis 10/11/2015  . Other specified abnormal findings of blood chemistry 01/14/2020  . Paroxysmal atrial fibrillation (HCC) 03/02/2015   Brief during cardiac cath, not anticoagulated  . Pneumonia 01/14/2020  . Prediabetes 10/11/2015  . Pulmonary fibrosis (HCC) 10/11/2015  . Radicular pain of both lower extremities 01/14/2020  . Rotaviral gastroenteritis 01/14/2020  . S/P lumbar spinal fusion 01/14/2020  . Screening for prostate cancer 04/10/2018   Chooses no further testing.  . Sinus bradycardia 04/28/2019  . Status post stroke 10/13/2019   Formatting of this note might be different from the original. 10/10/2019  . Stroke (HCC)   . TIA (transient ischemic attack) 01/14/2020  . Vitamin B12 deficiency 10/03/2016  . Wound infection after surgery 01/14/2020    Social History:  Social History   Socioeconomic History  . Marital status: Married    Spouse name: Not on file  . Number of children:  Not on file  . Years of education: Not on file  . Highest education level: Not on file  Occupational History  . Not on file  Tobacco Use  . Smoking status: Former Smoker    Types: Cigarettes  . Smokeless tobacco: Former Systems developer    Types: Chew  Substance and Sexual Activity  . Alcohol use: Yes    Comment: occ  . Drug use: Not Currently  . Sexual activity: Not on file  Other Topics Concern  . Not on file  Social History Narrative  . Not on file   Social Determinants of Health   Financial Resource Strain:   . Difficulty of Paying Living Expenses:   Food Insecurity:   . Worried About Charity fundraiser in the Last Year:   . Arboriculturist in the Last Year:     Transportation Needs:   . Film/video editor (Medical):   Marland Kitchen Lack of Transportation (Non-Medical):   Physical Activity:   . Days of Exercise per Week:   . Minutes of Exercise per Session:   Stress:   . Feeling of Stress :   Social Connections:   . Frequency of Communication with Friends and Family:   . Frequency of Social Gatherings with Friends and Family:   . Attends Religious Services:   . Active Member of Clubs or Organizations:   . Attends Archivist Meetings:   Marland Kitchen Marital Status:   Intimate Partner Violence:   . Fear of Current or Ex-Partner:   . Emotionally Abused:   Marland Kitchen Physically Abused:   . Sexually Abused:     Medications:   Current Outpatient Medications on File Prior to Visit  Medication Sig Dispense Refill  . acetaminophen (TYLENOL) 325 MG tablet Take 650 mg by mouth every 6 (six) hours as needed.    Marland Kitchen albuterol (VENTOLIN HFA) 108 (90 Base) MCG/ACT inhaler Inhale 2 puffs into the lungs every 6 (six) hours as needed.    Marland Kitchen amLODipine (NORVASC) 10 MG tablet Take 10 mg by mouth daily.    Marland Kitchen apixaban (ELIQUIS) 5 MG TABS tablet Take 5 mg by mouth in the morning and at bedtime.    . Ascorbic Acid (VITAMIN C) 1000 MG tablet Take 1,000 mg by mouth 2 (two) times daily.    Marland Kitchen atorvastatin (LIPITOR) 40 MG tablet Take 40 mg by mouth at bedtime.    . Carboxymethylcellulose Sod PF (REFRESH PLUS) 0.5 % SOLN Administer 2 drops to both eyes daily at 0600.    . carvedilol (COREG) 6.25 MG tablet Take 1 tablet (6.25 mg total) by mouth 2 (two) times daily. 180 tablet 3  . Cholecalciferol (VITAMIN D PO) Take 1 capsule by mouth daily.    . clopidogrel (PLAVIX) 75 MG tablet Take 75 mg by mouth daily.    . cyanocobalamin 1000 MCG tablet Take 1,000 mcg by mouth daily.    . finasteride (PROSCAR) 5 MG tablet Take 5 mg by mouth daily.    . fluticasone (FLONASE) 50 MCG/ACT nasal spray 2 sprays by Each Nare route Two (2) times a day.    . furosemide (LASIX) 40 MG tablet TAKE 2 TABLETS  BY MOUTH IN THE MORNING AND  1  TABLET  IN  THE  AFTERNOON 270 tablet 0  . isosorbide mononitrate (IMDUR) 30 MG 24 hr tablet Take 1 tablet (30 mg total) by mouth daily. 30 tablet 3  . Mometasone Furoate (ASMANEX HFA IN) Inhale 2 puffs into the lungs 2 (  two) times daily.    . nitroGLYCERIN (NITROSTAT) 0.4 MG SL tablet Place 0.4 mg under the tongue every 5 (five) minutes as needed for chest pain.    . pantoprazole (PROTONIX) 40 MG tablet Take 40 mg by mouth daily.    . Probiotic Product (PROBIOTIC DAILY PO) Take 1 capsule by mouth daily.    Marland Kitchen spironolactone (ALDACTONE) 25 MG tablet Take 25 mg by mouth daily.    . tamsulosin (FLOMAX) 0.4 MG CAPS capsule Take 0.4 mg by mouth daily.    Marland Kitchen telmisartan (MICARDIS) 40 MG tablet Take 40 mg by mouth daily. Takes 80 mg in the am and 40 mg at night     No current facility-administered medications on file prior to visit.    Allergies:  No Known Allergies   Vitals Today's Vitals   02/03/20 1253 02/03/20 1344  BP: (!) 150/68 (!) 120/58  Pulse: 61   Weight: 250 lb (113.4 kg)   Height: 5\' 10"  (1.778 m)    Body mass index is 35.87 kg/m.    Physical Exam General: Obese pleasant elderly Caucasian male seated, in no evident distress Head: head normocephalic and atraumatic.   Neck: supple with no carotid or supraclavicular bruits Cardiovascular: regular rate and rhythm, no murmurs Musculoskeletal: no deformity Skin:  no rash/petichiae Vascular:  Normal pulses all extremities  Neurologic Exam Mental Status: Awake and fully alert.  Fluent speech and language.  Oriented to place and time. Recent and remote memory intact. Attention span, concentration and fund of knowledge appropriate. Mood and affect appropriate.  Cranial Nerves: Pupils equal, briskly reactive to light. Extraocular movements full without nystagmus. Visual fields full show partial right homonymous hemianopsia to confrontation. Hearing intact. Facial sensation intact. Face, tongue, palate  moves normally and symmetrically.  Motor: Normal bulk and tone. Normal strength in all tested extremity muscles. Sensory.:  Decreased pinprick bilateral hand phalanges with intact sensation starting at metacarpal joint.  Decreased vibratory and pinprick sensation bilateral lower extremities distally Coordination: Rapid alternating movements normal in all extremities. Finger-to-nose and heel-to-shin performed accurately bilaterally. Gait and Station: Arises from chair without difficulty. Stance is normal. Gait demonstrates normal stride length and mild imbalance with use of cane Reflexes: 1+ and symmetric. Toes downgoing.       ASSESSMENT: 79 year old Caucasian male with embolic left occipital infarct in January 2021 likely secondary to paroxysmal atrial fibrillation with multiple vascular risk factors of obesity, hypertension, hyperlipidemia, coronary artery disease, prior tobacco use, extracranial carotid stenosis and atrial fibrillation.  Residual deficits of right peripheral impairment and imbalance.  He also endorses bilateral fingertip numbness/tingling and bilateral distal lower extremity numbness/tingling.  Also complains of occasional dizziness/lightheadedness sensation     PLAN:  Stroke -Continue Eliquis (apixaban) daily, clopidogrel and atorvastatin for secondary stroke prevention -Repeat carotid ultrasound for surveillance monitoring of known bilateral carotid stenosis -Advised numbness/tingling likely due to nerve damage from history of working as a February 2021.  He denies nerve type pain and advised to continue to monitor at this time and to avoid activities that may cause worsening damage along with ensuring proper footwear.  Advised to continue to follow with PCP for monitoring management -Advised lightheadedness/dizziness sensation could possibly due to fluctuation of blood pressure and to check blood pressure during dizziness episode.  Also advised increasing fluid intake and  avoiding rapid positions.  Defer further evaluation to PCP/cardiology -maintain strict control of hypertension with blood pressure goal below 130/90, diabetes with hemoglobin A1c goal below 6.5% and lipids with LDL cholesterol  goal below 70 mg/dL. I also advised the patient to eat a healthy diet with plenty of whole grains, cereals, fruits and vegetables, exercise regularly and maintain ideal body weight.    .   Follow-up in 6 months or call earlier if needed   I spent 35 minutes of face-to-face and non-face-to-face time with patient.  This included previsit chart review, lab review, study review, order entry, electronic health record documentation, patient education  Ihor Austin, [facility]  [facility] [address] [place], [place] [address]  Phone [phone] Fax [phone] Note: This document was prepared with digital dictation and possible smart phrase technology. Any transcriptional errors that result from this process are unintentional.

## 2020-02-03 NOTE — Patient Instructions (Addendum)
Continue clopidogrel 75 mg daily and Eliquis (apixaban) daily  and atorvastatin  for secondary stroke prevention  Continue to follow up with PCP regarding cholesterol and blood pressure management   Continue to follow with cardiology for atrial fibrillation and eliquis management   Continue to monitor blood pressure at home as well as checking when you become dizziness  Your numbness is likely due to your life career and damage to your nerves over time. Please ensure you wear proper supportive foot wear and avoid activities that can cause damage to your fingers. Follow up with your PCP if symptoms worsen  Maintain strict control of hypertension with blood pressure goal below 130/90, diabetes with hemoglobin A1c goal below 6.5% and cholesterol with LDL cholesterol (bad cholesterol) goal below 70 mg/dL. I also advised the patient to eat a healthy diet with plenty of whole grains, cereals, fruits and vegetables, exercise regularly and maintain ideal body weight.  Followup in the future with me in 6 months or call earlier if needed       Thank you for coming to see [place] at [facility]. I hope we have been able to provide you high quality care today.  You may receive a patient satisfaction survey over the next few weeks. We would appreciate your feedback and comments so that we may continue to improve ourselves and the health of our patients.

## 2020-02-03 NOTE — Progress Notes (Signed)
I agree with the above plan 

## 2020-02-13 ENCOUNTER — Other Ambulatory Visit: Payer: Self-pay

## 2020-02-13 ENCOUNTER — Ambulatory Visit (HOSPITAL_COMMUNITY)
Admission: RE | Admit: 2020-02-13 | Discharge: 2020-02-13 | Disposition: A | Discharge status: Home / Self Care | Payer: Medicare HMO | Source: Ambulatory Visit | Attending: Adult Health | Admitting: Adult Health

## 2020-02-13 DIAGNOSIS — I6523 Occlusion and stenosis of bilateral carotid arteries: Secondary | ICD-10-CM | POA: Diagnosis not present

## 2020-02-25 ENCOUNTER — Telehealth: Payer: Self-pay | Admitting: *Deleted

## 2020-02-25 NOTE — Telephone Encounter (Signed)
I would advise him to follow-up with PCP for further evaluation or may need to be evaluated by cardiology or ophthalmology

## 2020-02-25 NOTE — Telephone Encounter (Signed)
Advised patient that recent carotid ultrasound showed slightly increased stenosis of right ICA with decrease stenosis left ICA. As he remains asymptomatic and right ICA stenosis is not greater than 80%, recommend repeat carotid ultrasound in 6 months per up-to-date recommendations along with importance of aggressive stroke risk factor management and ongoing follow-up with PCP for monitoring and management.  I relayed this to pt.  He stated that he noted that when he exerts himself he notices his vision gets more blurry.  I would relay to JM/NP.

## 2020-02-25 NOTE — Telephone Encounter (Signed)
-----   Message from Ihor Austin, NP sent at 02/25/2020  8:58 AM EDT ----- Please advise patient that recent carotid ultrasound showed slightly increased stenosis of right ICA with decrease stenosis left ICA.  As he remains asymptomatic and right ICA stenosis is not greater than 80%, recommend repeat carotid ultrasound in 6 months per up-to-date recommendations along with importance of aggressive stroke risk factor management and ongoing follow-up with PCP for monitoring and management

## 2020-02-26 NOTE — Telephone Encounter (Signed)
I called pt and relayed to him per JM/NP recommendation that see pcp for evaluation on blurry vision on exertion, may recommend to see cardiology or opthomology. He verbalized understanding.

## 2020-03-12 NOTE — Progress Notes (Signed)
Recommend repeat 6 monthly is stable x 1 then yearly

## 2020-03-25 DIAGNOSIS — M159 Polyosteoarthritis, unspecified: Secondary | ICD-10-CM | POA: Insufficient documentation

## 2020-03-25 DIAGNOSIS — I10 Essential (primary) hypertension: Secondary | ICD-10-CM | POA: Diagnosis not present

## 2020-03-25 DIAGNOSIS — M545 Low back pain, unspecified: Secondary | ICD-10-CM | POA: Insufficient documentation

## 2020-03-25 DIAGNOSIS — H8113 Benign paroxysmal vertigo, bilateral: Secondary | ICD-10-CM | POA: Diagnosis not present

## 2020-03-25 DIAGNOSIS — R5383 Other fatigue: Secondary | ICD-10-CM | POA: Diagnosis not present

## 2020-03-25 DIAGNOSIS — G8929 Other chronic pain: Secondary | ICD-10-CM | POA: Insufficient documentation

## 2020-03-25 DIAGNOSIS — I639 Cerebral infarction, unspecified: Secondary | ICD-10-CM

## 2020-03-25 DIAGNOSIS — R5381 Other malaise: Secondary | ICD-10-CM | POA: Diagnosis not present

## 2020-03-25 DIAGNOSIS — Z961 Presence of intraocular lens: Secondary | ICD-10-CM

## 2020-03-25 DIAGNOSIS — J310 Chronic rhinitis: Secondary | ICD-10-CM

## 2020-03-25 DIAGNOSIS — M19071 Primary osteoarthritis, right ankle and foot: Secondary | ICD-10-CM | POA: Insufficient documentation

## 2020-03-25 HISTORY — DX: Polyosteoarthritis, unspecified: M15.9

## 2020-03-25 HISTORY — DX: Chronic rhinitis: J31.0

## 2020-03-25 HISTORY — DX: Low back pain, unspecified: M54.50

## 2020-03-25 HISTORY — DX: Cerebral infarction, unspecified: I63.9

## 2020-03-25 HISTORY — DX: Other chronic pain: G89.29

## 2020-03-25 HISTORY — DX: Presence of intraocular lens: Z96.1

## 2020-03-25 HISTORY — DX: Primary osteoarthritis, right ankle and foot: M19.071

## 2020-04-26 ENCOUNTER — Telehealth: Payer: Self-pay | Admitting: Cardiology

## 2020-04-26 DIAGNOSIS — I10 Essential (primary) hypertension: Secondary | ICD-10-CM | POA: Diagnosis not present

## 2020-04-26 DIAGNOSIS — F528 Other sexual dysfunction not due to a substance or known physiological condition: Secondary | ICD-10-CM

## 2020-04-26 DIAGNOSIS — R519 Headache, unspecified: Secondary | ICD-10-CM | POA: Diagnosis not present

## 2020-04-26 DIAGNOSIS — M47812 Spondylosis without myelopathy or radiculopathy, cervical region: Secondary | ICD-10-CM | POA: Diagnosis not present

## 2020-04-26 DIAGNOSIS — Z9861 Coronary angioplasty status: Secondary | ICD-10-CM

## 2020-04-26 DIAGNOSIS — M4802 Spinal stenosis, cervical region: Secondary | ICD-10-CM | POA: Diagnosis not present

## 2020-04-26 DIAGNOSIS — R079 Chest pain, unspecified: Secondary | ICD-10-CM | POA: Diagnosis not present

## 2020-04-26 DIAGNOSIS — I110 Hypertensive heart disease with heart failure: Secondary | ICD-10-CM | POA: Diagnosis not present

## 2020-04-26 DIAGNOSIS — R5383 Other fatigue: Secondary | ICD-10-CM | POA: Diagnosis not present

## 2020-04-26 DIAGNOSIS — R5381 Other malaise: Secondary | ICD-10-CM | POA: Diagnosis not present

## 2020-04-26 DIAGNOSIS — R200 Anesthesia of skin: Secondary | ICD-10-CM | POA: Diagnosis not present

## 2020-04-26 DIAGNOSIS — I480 Paroxysmal atrial fibrillation: Secondary | ICD-10-CM | POA: Diagnosis not present

## 2020-04-26 DIAGNOSIS — M5416 Radiculopathy, lumbar region: Secondary | ICD-10-CM | POA: Diagnosis not present

## 2020-04-26 DIAGNOSIS — R42 Dizziness and giddiness: Secondary | ICD-10-CM | POA: Insufficient documentation

## 2020-04-26 HISTORY — DX: Coronary angioplasty status: Z98.61

## 2020-04-26 HISTORY — DX: Other sexual dysfunction not due to a substance or known physiological condition: F52.8

## 2020-04-26 HISTORY — DX: Dizziness and giddiness: R42

## 2020-04-26 NOTE — Telephone Encounter (Signed)
Spoke to patient just now and let him know Dr. Hulen Shouts recommendation to proceed to the ED. He states that he will consider going but is unsure if he will at this time. He verbalizes understanding.

## 2020-04-26 NOTE — Telephone Encounter (Signed)
Pt c/o of Chest Pain: STAT if CP now or developed within 24 hours  1. Are you having CP right now? Yes  2. Are you experiencing any other symptoms (ex. SOB, nausea, vomiting, sweating)? Dizziness and Fatigue   3. How long have you been experiencing CP? Started over the weekend   4. Is your CP continuous or coming and going? Continuous   5. Have you taken Nitroglycerin? Not sure    ?

## 2020-04-26 NOTE — Telephone Encounter (Signed)
With visual changes advise ED evaluation

## 2020-04-26 NOTE — Telephone Encounter (Signed)
Spoke to the patient just now and he let me know that he has been having chest pain that is coming and going throughout the last three months. He states that he is not having any chest pain right now. However, he states that he is dizzy and his visual field is not right. He states that it feels like he is looking through a tunnel at all times. I advised that with these symptoms I felt that the patient would be best evaluated in the emergency room. He states that he is not going to do this and that he would rather just go home. He states that these symptoms have been going on for the last several months.   I tried to go over the patients medication list with him but he stated that several have changed and he is unable to discuss it with me at this time.   I will forward to Dr. Dulce Sellar to see if he has any further advise or recommendations.

## 2020-04-28 DIAGNOSIS — I440 Atrioventricular block, first degree: Secondary | ICD-10-CM | POA: Diagnosis not present

## 2020-04-28 DIAGNOSIS — R001 Bradycardia, unspecified: Secondary | ICD-10-CM | POA: Diagnosis not present

## 2020-06-28 ENCOUNTER — Telehealth: Payer: Self-pay | Admitting: Cardiology

## 2020-06-28 MED ORDER — ATORVASTATIN CALCIUM 40 MG PO TABS: 40.0000 mg | ORAL_TABLET | Freq: Every day | ORAL | 3 refills | Status: DC

## 2020-06-28 NOTE — Telephone Encounter (Signed)
Refill sent in per request.  

## 2020-06-28 NOTE — Telephone Encounter (Signed)
*  STAT* If patient is at the pharmacy, call can be transferred to refill team.   1. Which medications need to be refilled? (please list name of each medication and dose if known) atorvastatin   2. Which pharmacy/location (including street and city if local pharmacy) is medication to be sent to? [facility]  3. Do they need a 30 day or 90 day supply?  Not sure

## 2020-07-19 ENCOUNTER — Other Ambulatory Visit: Payer: Self-pay

## 2020-07-19 DIAGNOSIS — I639 Cerebral infarction, unspecified: Secondary | ICD-10-CM | POA: Insufficient documentation

## 2020-07-19 NOTE — Progress Notes (Signed)
Cardiology Office Note:    Date:  07/20/2020   ID:  Jimmy Chapman, DOB [birth year], MRN [id number]  PCP:  Gordan Payment., MD  Cardiologist:  Norman Herrlich, MD    Referring MD: Gordan Payment., MD    ASSESSMENT:    1. Coronary artery disease of bypass graft of native heart with stable angina pectoris (HCC)   2. Paroxysmal atrial fibrillation (HCC)   3. Chronic anticoagulation   4. Hypertensive heart disease with heart failure (HCC)   5. Mixed hyperlipidemia    PLAN:    In order of problems listed above:  1. Stable CAD continue current medical therapy including combined anticoagulant clopidogrel beta-blocker and lipid-lowering.  Having no angina New York Heart Association class I 2. Stable maintaining sinus rhythm continue anticoagulation with previous stroke BP at target continue current antihypertensives ARB MRA but heart failure is decompensated increase his diuretic check BMP proBNP Continue his high intensity statin  Next appointment: Stable CAD   Medication Adjustments/Labs and Tests Ordered: Current medicines are reviewed at length with the patient today.  Concerns regarding medicines are outlined above.  Orders Placed This Encounter  Procedures  . Basic metabolic panel  . Pro b natriuretic peptide (BNP)  . EKG 12-Lead   Meds ordered this encounter  Medications  . furosemide (LASIX) 80 MG tablet    Sig: Take 1 tablet (80 mg total) by mouth 2 (two) times daily.    Dispense:  180 tablet    Refill:  3    No chief complaint on file.   History of Present Illness:    Jimmy Chapman is a 79 y.o. male with a hx of CAD with multivessel PCI diastolic heart failure brief isolated PAF during PCI hypertension hyperlipidemia obstructive sleep apnea.  Most recently he underwent left heart catheterization 04/24/2019 EF 60% moderate stenosis of the right coronary artery distal LAD recommended for medical therapy.  Subsequently January 2021 had visual loss and left occipital  stroke.  At that time he was initiated anticoagulant therapy and continued on antiplatelet therapy with clopidogrel.  He was last seen 01/15/2020. Compliance with diet, lifestyle and medications: Yes  Recently has struggled not feeling well he has had a lot of dizziness visual changes not really orthostatic in nature.  His weight is up at times he does not restrict dietary sodium and he noticed himself chronically short of breath with activities but not at night he uses CPAP he is unaware he had peripheral edema.  No chest pain palpitation or syncope. Past Medical History:  Diagnosis Date  . Acute cardioembolic stroke (HCC) 01/14/2020  . Acute respiratory failure (HCC) 01/14/2020  . Anemia, chronic disease 10/03/2016  . Angina pectoris (HCC) 02/04/2015  . Aortic calcification (HCC) 10/11/2015  . Atherosclerosis of both carotid arteries 10/13/2019   Formatting of this note might be different from the original. 09/2019  . Atherosclerotic heart disease of native coronary artery without angina pectoris 02/03/2015   Formatting of this note might be different from the original. 1. Multivessel PCI in Nov 2014 with 3 Xience stents to proximal LAD, LCF and RCA, EF 45% 2. 02/03/15 with PCI and resolute stennt LCF and PTCA of ostial RCA stenosis, EF 60% for unstable angina Formatting of this note might be different from the original. Last cath 04/2019. Hx PCI. 70% lesions. Medical management.  . Benign essential HTN 02/03/2015  . Benign prostatic hyperplasia with urinary hesitancy 08/17/2016  . BPH (benign prostatic hyperplasia) 08/17/2016  . CAD (coronary artery  disease) 10/11/2015   1. Multivessel PCI in Nov 2014 with 3 Xience stents to proximal LAD, LCF and RCA, EF 45% 2. 02/03/15 with PCI and resolute stennt LCF and PTCA of ostial RCA stenosis, EF 60% for unstable angina  . Cerebral infarction (HCC) 03/25/2020  . Chest pain at rest 02/04/2015  . Chronic diastolic heart failure (HCC) 04/16/2018  . Chronic low back pain  03/25/2020  . Chronic rhinitis 03/25/2020  . Class 2 severe obesity due to excess calories with serious comorbidity and body mass index (BMI) of 36.0 to 36.9 in adult (HCC) 10/18/2015  . COPD, mild (HCC) 10/13/2019  . Coronary artery disease of bypass graft of native heart with stable angina pectoris (HCC) 04/28/2019  . Coronary disease 10/11/2015   1. Multivessel PCI in Nov 2014 with 3 Xience stents to proximal LAD, LCF and RCA, EF 45% 2. 02/03/15 with PCI and resolute stennt LCF and PTCA of ostial RCA stenosis, EF 60% for unstable angina (b.) 04/25/19 cath with mod RCA and mod LAD disease recommended for med therapy with normal EF  . Degeneration of lumbar intervertebral disc 08/17/2016  . Degenerative lumbar disc 08/17/2016  . Dizziness of unknown etiology 04/26/2020  . Essential hypertension 10/11/2015  . Generalized osteoarthritis 03/25/2020  . High risk medication use 10/15/2015  . Hyperlipidemia 02/03/2015  . Hypertensive heart disease with heart failure (HCC) 02/03/2015  . Hypoxia 10/11/2015  . LAE (left atrial enlargement) 10/11/2015  . Left ventricular hypertrophy 10/11/2015  . Localized edema 11/14/2016  . Lumbar radiculopathy 01/14/2020  . Malaise and fatigue 10/11/2015  . Nausea & vomiting 01/14/2020  . Obesity 01/14/2020  . Obesity (BMI 30.0-34.9) 10/18/2015  . Obstructive sleep apnea syndrome 04/10/2018   Wears CPAP  . Oliguria 01/14/2020  . Osteoarthritis 10/11/2015  . Other ill-defined and unknown causes of morbidity and mortality 10/18/2015  . Other specified abnormal findings of blood chemistry 01/14/2020  . Paroxysmal atrial fibrillation (HCC) 03/02/2015   Brief during cardiac cath, not anticoagulated  . Pneumonia 01/14/2020  . Prediabetes 10/11/2015  . Presence of intraocular lens 03/25/2020  . Primary osteoarthritis, right ankle and foot 03/25/2020  . Psychosexual dysfunction with inhibited sexual excitement 04/26/2020  . Pulmonary fibrosis (HCC) 10/11/2015  . Radicular pain of both lower extremities  01/14/2020  . Rotaviral gastroenteritis 01/14/2020  . S/P lumbar spinal fusion 01/14/2020  . Screening for prostate cancer 04/10/2018   Chooses no further testing.  . Seasonal allergic rhinitis due to pollen 01/23/2020  . Sinus bradycardia 04/28/2019  . Status post percutaneous transluminal coronary angioplasty 04/26/2020  . Status post stroke 10/13/2019   Formatting of this note might be different from the original. 10/10/2019  . Stroke (HCC)   . TIA (transient ischemic attack) 01/14/2020  . Vitamin B12 deficiency 10/03/2016  . Wound infection after surgery 01/14/2020    Past Surgical History:  Procedure Laterality Date  . APPENDECTOMY    . CATARACT EXTRACTION, BILATERAL    . CORONARY ANGIOPLASTY WITH STENT PLACEMENT    . HERNIA REPAIR      Current Medications: Current Meds  Medication Sig  . acetaminophen (TYLENOL) 325 MG tablet Take 650 mg by mouth every 6 (six) hours as needed.  Marland Kitchen albuterol (VENTOLIN HFA) 108 (90 Base) MCG/ACT inhaler Inhale 2 puffs into the lungs every 6 (six) hours as needed.  Marland Kitchen amLODipine (NORVASC) 10 MG tablet Take 10 mg by mouth daily.  Marland Kitchen apixaban (ELIQUIS) 5 MG TABS tablet Take 5 mg by mouth in the morning and  at bedtime.  . Ascorbic Acid (VITAMIN C) 1000 MG tablet Take 1,000 mg by mouth 2 (two) times daily.  Marland Kitchen atorvastatin (LIPITOR) 40 MG tablet Take 1 tablet (40 mg total) by mouth at bedtime.  Marland Kitchen atorvastatin (LIPITOR) 80 MG tablet Take 80 mg by mouth daily.  . Carboxymethylcellulose Sod PF (REFRESH PLUS) 0.5 % SOLN Administer 2 drops to both eyes daily at 0600.  . carvedilol (COREG) 6.25 MG tablet Take 1 tablet (6.25 mg total) by mouth 2 (two) times daily.  . Cholecalciferol (VITAMIN D PO) Take 1 capsule by mouth daily.  . clopidogrel (PLAVIX) 75 MG tablet Take 75 mg by mouth daily.  . cyanocobalamin 1000 MCG tablet Take 1,000 mcg by mouth daily.  . finasteride (PROSCAR) 5 MG tablet Take 5 mg by mouth daily.  . fluticasone (FLONASE) 50 MCG/ACT nasal spray 2 sprays  by Each Nare route Two (2) times a day.  . isosorbide mononitrate (IMDUR) 30 MG 24 hr tablet Take 1 tablet (30 mg total) by mouth daily.  . meclizine (ANTIVERT) 25 MG tablet Take 25-50 mg by mouth every 8 (eight) hours.  . Mometasone Furoate (ASMANEX HFA IN) Inhale 2 puffs into the lungs 2 (two) times daily.  . nitroGLYCERIN (NITROSTAT) 0.4 MG SL tablet Place 0.4 mg under the tongue every 5 (five) minutes as needed for chest pain.  . pantoprazole (PROTONIX) 40 MG tablet Take 40 mg by mouth daily.  . Probiotic Product (PROBIOTIC DAILY PO) Take 1 capsule by mouth daily.  Marland Kitchen spironolactone (ALDACTONE) 25 MG tablet Take 25 mg by mouth daily.  . tamsulosin (FLOMAX) 0.4 MG CAPS capsule Take 0.4 mg by mouth daily.  Marland Kitchen telmisartan (MICARDIS) 20 MG tablet Take 20 mg by mouth daily.  . [DISCONTINUED] furosemide (LASIX) 40 MG tablet TAKE 2 TABLETS BY MOUTH IN THE MORNING AND  1  TABLET  IN  THE  AFTERNOON     Allergies:   Patient has no known allergies.   Social History   Socioeconomic History  . Marital status: Married    Spouse name: Not on file  . Number of children: Not on file  . Years of education: Not on file  . Highest education level: Not on file  Occupational History  . Not on file  Tobacco Use  . Smoking status: Former Smoker    Types: Cigarettes  . Smokeless tobacco: Former Neurosurgeon    Types: Engineer, drilling  . Vaping Use: Never used  Substance and Sexual Activity  . Alcohol use: Yes    Comment: occ  . Drug use: Not Currently  . Sexual activity: Not on file  Other Topics Concern  . Not on file  Social History Narrative  . Not on file   Social Determinants of Health   Financial Resource Strain:   . Difficulty of Paying Living Expenses: Not on file  Food Insecurity:   . Worried About Programme researcher, broadcasting/film/video in the Last Year: Not on file  . Ran Out of Food in the Last Year: Not on file  Transportation Needs:   . Lack of Transportation (Medical): Not on file  . Lack of  Transportation (Non-Medical): Not on file  Physical Activity:   . Days of Exercise per Week: Not on file  . Minutes of Exercise per Session: Not on file  Stress:   . Feeling of Stress : Not on file  Social Connections:   . Frequency of Communication with Friends and Family: Not on file  .  Frequency of Social Gatherings with Friends and Family: Not on file  . Attends Religious Services: Not on file  . Active Member of Clubs or Organizations: Not on file  . Attends BankerClub or Organization Meetings: Not on file  . Marital Status: Not on file     Family History: The patient's family history includes Heart attack in his father; Hypertension in his mother. ROS:   Please see the history of present illness.    All other systems reviewed and are negative.  EKGs/Labs/Other Studies Reviewed:    The following studies were reviewed today:  EKG:  EKG ordered today and personally reviewed.  The ekg ordered today demonstrates sinus rhythm old septal MI  Recent Labs: 05/06/2020 CMP with a creatinine 1.39 GFR 49 cc potassium 4.5 Recent Lipid Panel    Component Value Date/Time   CHOL 166 04/07/2019 0918   TRIG 311 (H) 04/07/2019 0918   HDL 60 04/07/2019 0918   CHOLHDL 2.8 04/07/2019 0918   LDLCALC 44 04/07/2019 0918    Physical Exam:    VS:  BP (!) 150/64   Pulse 65   Ht 5\' 10"  (1.778 m)   Wt 260 lb (117.9 kg)   SpO2 94%   BMI 37.31 kg/m     Wt Readings from Last 3 Encounters:  07/20/20 260 lb (117.9 kg)  02/03/20 250 lb (113.4 kg)  01/15/20 252 lb 12.8 oz (114.7 kg)     GEN: Breathless well nourished, well developed in no acute distress HEENT: Normal NECK: No JVD; No carotid bruits LYMPHATICS: No lymphadenopathy CARDIAC: RRR, no murmurs, rubs, gallops RESPIRATORY:  Clear to auscultation without rales, wheezing or rhonchi  ABDOMEN: Soft, non-tender, non-distended MUSCULOSKELETAL: Bilateral pitting 2+ lower extremity edema; No deformity  SKIN: Warm and dry NEUROLOGIC:  Alert  and oriented x 3 PSYCHIATRIC:  Normal affect    Signed, Norman HerrlichBrian Aailyah Dunbar, MD  07/20/2020 8:37 AM    [facility]

## 2020-07-20 ENCOUNTER — Ambulatory Visit (INDEPENDENT_AMBULATORY_CARE_PROVIDER_SITE_OTHER): Payer: Medicare HMO | Admitting: Cardiology

## 2020-07-20 ENCOUNTER — Other Ambulatory Visit: Payer: Self-pay

## 2020-07-20 ENCOUNTER — Encounter: Payer: Self-pay | Admitting: Cardiology

## 2020-07-20 VITALS — BP 150/64 | HR 65 | Ht 70.0 in | Wt 260.0 lb

## 2020-07-20 DIAGNOSIS — I480 Paroxysmal atrial fibrillation: Secondary | ICD-10-CM | POA: Diagnosis not present

## 2020-07-20 DIAGNOSIS — Z7901 Long term (current) use of anticoagulants: Secondary | ICD-10-CM | POA: Diagnosis not present

## 2020-07-20 DIAGNOSIS — I110 Hypertensive heart disease with heart failure: Secondary | ICD-10-CM

## 2020-07-20 DIAGNOSIS — E782 Mixed hyperlipidemia: Secondary | ICD-10-CM

## 2020-07-20 DIAGNOSIS — I25708 Atherosclerosis of coronary artery bypass graft(s), unspecified, with other forms of angina pectoris: Secondary | ICD-10-CM

## 2020-07-20 MED ORDER — FUROSEMIDE 80 MG PO TABS: 80.0000 mg | ORAL_TABLET | Freq: Two times a day (BID) | ORAL | 3 refills | Status: DC

## 2020-07-20 NOTE — Patient Instructions (Addendum)
Medication Instructions:  Your physician has recommended you make the following change in your medication:  INCREASE: Furosemide 80 mg take one tablet by mouth twice daily.  *If you need a refill on your cardiac medications before your next appointment, please call your pharmacy*   Lab Work: Your physician recommends that you return for lab work in: TODAY BMP, ProBNP, Lipids If you have labs (blood work) drawn today and your tests are completely normal, you will receive your results only by: Marland Kitchen MyChart Message (if you have MyChart) OR . A paper copy in the mail If you have any lab test that is abnormal or we need to change your treatment, we will call you to review the results.   Testing/Procedures: None   Follow-Up: At [facility], you and your health needs are our priority.  As part of our continuing mission to provide you with exceptional heart care, we have created designated Provider Care Teams.  These Care Teams include your primary Cardiologist (physician) and Advanced Practice Providers (APPs -  Physician Assistants and Nurse Practitioners) who all work together to provide you with the care you need, when you need it.  We recommend signing up for the patient portal called "MyChart".  Sign up information is provided on this After Visit Summary.  MyChart is used to connect with patients for Virtual Visits (Telemedicine).  Patients are able to view lab/test results, encounter notes, upcoming appointments, etc.  Non-urgent messages can be sent to your provider as well.   To learn more about what you can do with MyChart, go to [email or web address].    Your next appointment:   6 month(s)  The format for your next appointment:   In Person  Provider:   Norman Herrlich, MD   Other Instructions

## 2020-07-20 NOTE — Addendum Note (Signed)
Addended by: Delorse Limber I on: 07/20/2020 08:41 AM   Modules accepted: Orders

## 2020-07-21 LAB — LIPID PANEL
Chol/HDL Ratio: 3.6 ratio (ref 0.0–5.0)
Cholesterol, Total: 162 mg/dL (ref 100–199)
HDL: 45 mg/dL (ref >39)
LDL Chol Calc (NIH): 77 mg/dL (ref 0–99)
Triglycerides: 247 mg/dL — ABNORMAL HIGH (ref 0–149)
VLDL Cholesterol Cal: 40 mg/dL (ref 5–40)

## 2020-07-21 LAB — BASIC METABOLIC PANEL
BUN/Creatinine Ratio: 17 (ref 10–24)
BUN: 17 mg/dL (ref 8–27)
CO2: 26 mmol/L (ref 20–29)
Calcium: 9.5 mg/dL (ref 8.6–10.2)
Chloride: 102 mmol/L (ref 96–106)
Creatinine, Ser: 1 mg/dL (ref 0.76–1.27)
GFR calc Af Amer: 84 mL/min/{1.73_m2} (ref >59)
GFR calc non Af Amer: 72 mL/min/{1.73_m2} (ref >59)
Glucose: 124 mg/dL — ABNORMAL HIGH (ref 65–99)
Potassium: 3.7 mmol/L (ref 3.5–5.2)
Sodium: 144 mmol/L (ref 134–144)

## 2020-07-21 LAB — PRO B NATRIURETIC PEPTIDE: NT-Pro BNP: 32 pg/mL (ref 0–486)

## 2020-08-09 ENCOUNTER — Ambulatory Visit: Payer: Medicare HMO | Admitting: Adult Health

## 2020-08-09 ENCOUNTER — Encounter: Payer: Self-pay | Admitting: Adult Health

## 2020-08-09 VITALS — BP 134/56 | HR 55 | Ht 70.0 in | Wt 257.0 lb

## 2020-08-09 DIAGNOSIS — I639 Cerebral infarction, unspecified: Secondary | ICD-10-CM

## 2020-08-09 DIAGNOSIS — I10 Essential (primary) hypertension: Secondary | ICD-10-CM

## 2020-08-09 DIAGNOSIS — E785 Hyperlipidemia, unspecified: Secondary | ICD-10-CM

## 2020-08-09 DIAGNOSIS — I6523 Occlusion and stenosis of bilateral carotid arteries: Secondary | ICD-10-CM | POA: Diagnosis not present

## 2020-08-09 DIAGNOSIS — R200 Anesthesia of skin: Secondary | ICD-10-CM | POA: Diagnosis not present

## 2020-08-09 DIAGNOSIS — I480 Paroxysmal atrial fibrillation: Secondary | ICD-10-CM

## 2020-08-09 DIAGNOSIS — R42 Dizziness and giddiness: Secondary | ICD-10-CM

## 2020-08-09 NOTE — Progress Notes (Signed)
I agree with the above plan 

## 2020-08-09 NOTE — Patient Instructions (Signed)
Continue to follow with eye doctor and consider being seen by neuro-ophthalmology who can further evaluate and even possibly be seen by a low light specialist   Continue to monitor bilateral hand numbness and if worsens, may consider further evaluation with EMG/Nerve conduction study   Recommend repeat carotid ultrasound for surveillance monitoring of carotid stenosis  Continue clopidogrel 75 mg daily and Eliquis (apixaban) daily  and atorvastatin  for secondary stroke prevention  Continue to follow up with PCP regarding cholesterol, blood pressure and diabetes management  Maintain strict control of hypertension with blood pressure goal below 130/90, diabetes with hemoglobin A1c goal below 7.0% and cholesterol with LDL cholesterol (bad cholesterol) goal below 70 mg/dL.      Followup in the future with me in 6 months or call earlier if needed      Thank you for coming to see [place] at [facility]. I hope we have been able to provide you high quality care today.  You may receive a patient satisfaction survey over the next few weeks. We would appreciate your feedback and comments so that we may continue to improve ourselves and the health of our patients.

## 2020-08-09 NOTE — Progress Notes (Signed)
[facility] [address] [place] [address] [phone]       STROKE FOLLOW UP NOTE  [name] Date of Birth:  01-Dec-1940 Medical Record Number:  [id number]   Referring MD:  Alexandria Lodge Reason for Referral:  stroke  Chief complaint: Chief Complaint  Patient presents with  . Follow-up    RM 9  . Cerebrovascular Accident      HPI:  Today, 08/09/2020, [name] returns for 77-month stroke follow-up unaccompanied.  Stable since prior visit with residual visual impairment which has been stable without worsening.  He is routinely followed by ophthalmology and reports stable exam.  Prior complaints of dizziness have improved since decreasing spironolactone dosage.  Denies new or worsening stroke/TIA symptoms.  Remains on Eliquis and Plavix without bleeding or bruising and atorvastatin without myalgias.  Blood pressure today 134/56.  Routinely monitors at home which has been stable.  Reports prior frequent episodes of hypotension but this is since subsided after spironolactone dosage adjustment.  Reports compliance with CPAP.  Continued bilateral fingertip numbness which is chronic and stable without worsening.  PCP obtain CT cervical and CT head which was largely unremarkable.  No further concerns at this time.    History provided for reference purposes only Update 02/03/2020 JM: [name] returns for follow-up regarding left occipital stroke in 09/2019.  Residual deficits of right peripheral visual impairment and occasional imbalance and dizziness which has been stable without worsening.  He also reports gradual worsening of bilateral fingertip numbness with decreased sensation and occasional bilateral foot numbness/tingling.  Long history of working as a Curator as well as daily walking on concrete floors.  Denies any pain associated.  Continues on Eliquis and clopidogrel for secondary stroke prevention, history of atrial fibrillation and significant  CAD.  Also continues on atorvastatin for secondary stroke prevention.  Blood pressure today 150/68. Monitors routinely at home 130-150/60s with recent adjustments made to antihypertensives.  He does report dizziness occasionally upon position changes or randomly throughout the day.  Endorses ongoing compliance with CPAP for OSA management.  No further concerns at this time.  Initial visit 11/06/2019 Dr. Pearlean Brownie: [name] is 79 year old Caucasian male with past medical history of hypertension, coronary artery disease status post MI with stent placement, hyperlipidemia, asthma, osteoarthritis congestive heart failure and paroxysmal A. fib as well as diabetes who presented to [facility] on 10/09/2019 with sudden onset of loss of vision.  He states that he was working on a car and bent down and all of a sudden he noticed that for the moment he could not see completely out of both eyes and subsequently noticed that part of the vision came back on the left side.  The did not regain vision on the right peripheral field of vision for quite some time.  He was seen by ophthalmologist who found his blood pressure to be significantly elevated and referred him to the hospital for stroke work-up.  Denies any accompanying nausea, vomiting, slurred speech, extremity weakness or numbness.  CT scan of the head in the emergency room was unremarkable subsequently MRI was obtained which showed a left occipital cortical small infarct.  Extracranial carotid ultrasound showed 50 to 69% bilateral patient apparently was seen by neurology but I do not have those notes to review.  Cardiology was consulted recommended he be started on Eliquis.  Aspirin was discontinued and he was continued on Plavix and Eliquis due to his significant coronary artery disease.  Patient states he has  been driving well without any accidents but is aware that his right-sided peripheral vision is restricted yet.  He states his blood pressure continues to be  elevated and is working with the primary care physician to get it under control.  He was tolerating Eliquis and but was recently switched to Pradaxa by the [place] as they do not carry Eliquis.  Is tolerating it well without bleeding or bruising.  He is is aware that he needs to been on diet and exercise regularly and lose weight.  He has no complaints today.  Stenosis and 2D echo was unremarkable.  He is ex-smoker quit 18 years ago.  He denies prior history of strokes, TIAs or significant neurological problems.  ROS:   14 system review of systems is positive for those listed in HPI and all other systems negative  PMH:  Past Medical History:  Diagnosis Date  . Acute cardioembolic stroke (HCC) 01/14/2020  . Acute respiratory failure (HCC) 01/14/2020  . Anemia, chronic disease 10/03/2016  . Angina pectoris (HCC) 02/04/2015  . Aortic calcification (HCC) 10/11/2015  . Atherosclerosis of both carotid arteries 10/13/2019   Formatting of this note might be different from the original. 09/2019  . Atherosclerotic heart disease of native coronary artery without angina pectoris 02/03/2015   Formatting of this note might be different from the original. 1. Multivessel PCI in Nov 2014 with 3 Xience stents to proximal LAD, LCF and RCA, EF 45% 2. 02/03/15 with PCI and resolute stennt LCF and PTCA of ostial RCA stenosis, EF 60% for unstable angina Formatting of this note might be different from the original. Last cath 04/2019. Hx PCI. 70% lesions. Medical management.  . Benign essential HTN 02/03/2015  . Benign prostatic hyperplasia with urinary hesitancy 08/17/2016  . BPH (benign prostatic hyperplasia) 08/17/2016  . CAD (coronary artery disease) 10/11/2015   1. Multivessel PCI in Nov 2014 with 3 Xience stents to proximal LAD, LCF and RCA, EF 45% 2. 02/03/15 with PCI and resolute stennt LCF and PTCA of ostial RCA stenosis, EF 60% for unstable angina  . Cerebral infarction (HCC) 03/25/2020  . Chest pain at rest 02/04/2015  . Chronic  diastolic heart failure (HCC) 04/16/2018  . Chronic low back pain 03/25/2020  . Chronic rhinitis 03/25/2020  . Class 2 severe obesity due to excess calories with serious comorbidity and body mass index (BMI) of 36.0 to 36.9 in adult (HCC) 10/18/2015  . COPD, mild (HCC) 10/13/2019  . Coronary artery disease of bypass graft of native heart with stable angina pectoris (HCC) 04/28/2019  . Coronary disease 10/11/2015   1. Multivessel PCI in Nov 2014 with 3 Xience stents to proximal LAD, LCF and RCA, EF 45% 2. 02/03/15 with PCI and resolute stennt LCF and PTCA of ostial RCA stenosis, EF 60% for unstable angina (b.) 04/25/19 cath with mod RCA and mod LAD disease recommended for med therapy with normal EF  . Degeneration of lumbar intervertebral disc 08/17/2016  . Degenerative lumbar disc 08/17/2016  . Dizziness of unknown etiology 04/26/2020  . Essential hypertension 10/11/2015  . Generalized osteoarthritis 03/25/2020  . High risk medication use 10/15/2015  . Hyperlipidemia 02/03/2015  . Hypertensive heart disease with heart failure (HCC) 02/03/2015  . Hypoxia 10/11/2015  . LAE (left atrial enlargement) 10/11/2015  . Left ventricular hypertrophy 10/11/2015  . Localized edema 11/14/2016  . Lumbar radiculopathy 01/14/2020  . Malaise and fatigue 10/11/2015  . Nausea & vomiting 01/14/2020  . Obesity 01/14/2020  . Obesity (BMI 30.0-34.9) 10/18/2015  .  Obstructive sleep apnea syndrome 04/10/2018   Wears CPAP  . Oliguria 01/14/2020  . Osteoarthritis 10/11/2015  . Other ill-defined and unknown causes of morbidity and mortality 10/18/2015  . Other specified abnormal findings of blood chemistry 01/14/2020  . Paroxysmal atrial fibrillation (HCC) 03/02/2015   Brief during cardiac cath, not anticoagulated  . Pneumonia 01/14/2020  . Prediabetes 10/11/2015  . Presence of intraocular lens 03/25/2020  . Primary osteoarthritis, right ankle and foot 03/25/2020  . Psychosexual dysfunction with inhibited sexual excitement 04/26/2020  . Pulmonary  fibrosis (HCC) 10/11/2015  . Radicular pain of both lower extremities 01/14/2020  . Rotaviral gastroenteritis 01/14/2020  . S/P lumbar spinal fusion 01/14/2020  . Screening for prostate cancer 04/10/2018   Chooses no further testing.  . Seasonal allergic rhinitis due to pollen 01/23/2020  . Sinus bradycardia 04/28/2019  . Status post percutaneous transluminal coronary angioplasty 04/26/2020  . Status post stroke 10/13/2019   Formatting of this note might be different from the original. 10/10/2019  . Stroke (HCC)   . TIA (transient ischemic attack) 01/14/2020  . Vitamin B12 deficiency 10/03/2016  . Wound infection after surgery 01/14/2020    Social History:  Social History   Socioeconomic History  . Marital status: Married    Spouse name: Not on file  . Number of children: Not on file  . Years of education: Not on file  . Highest education level: Not on file  Occupational History  . Not on file  Tobacco Use  . Smoking status: Former Smoker    Types: Cigarettes  . Smokeless tobacco: Former NeurosurgeonUser    Types: Engineer, drillingChew  Vaping Use  . Vaping Use: Never used  Substance and Sexual Activity  . Alcohol use: Yes    Comment: occ  . Drug use: Not Currently  . Sexual activity: Not on file  Other Topics Concern  . Not on file  Social History Narrative  . Not on file   Social Determinants of Health   Financial Resource Strain:   . Difficulty of Paying Living Expenses: Not on file  Food Insecurity:   . Worried About Programme researcher, broadcasting/film/videounning Out of Food in the Last Year: Not on file  . Ran Out of Food in the Last Year: Not on file  Transportation Needs:   . Lack of Transportation (Medical): Not on file  . Lack of Transportation (Non-Medical): Not on file  Physical Activity:   . Days of Exercise per Week: Not on file  . Minutes of Exercise per Session: Not on file  Stress:   . Feeling of Stress : Not on file  Social Connections:   . Frequency of Communication with Friends and Family: Not on file  . Frequency of  Social Gatherings with Friends and Family: Not on file  . Attends Religious Services: Not on file  . Active Member of Clubs or Organizations: Not on file  . Attends BankerClub or Organization Meetings: Not on file  . Marital Status: Not on file  Intimate Partner Violence:   . Fear of Current or Ex-Partner: Not on file  . Emotionally Abused: Not on file  . Physically Abused: Not on file  . Sexually Abused: Not on file    Medications:   Current Outpatient Medications on File Prior to Visit  Medication Sig Dispense Refill  . acetaminophen (TYLENOL) 325 MG tablet Take 650 mg by mouth every 6 (six) hours as needed.    Marland Kitchen. albuterol (VENTOLIN HFA) 108 (90 Base) MCG/ACT inhaler Inhale 2 puffs into the  lungs every 6 (six) hours as needed.    Marland Kitchen amLODipine (NORVASC) 10 MG tablet Take 10 mg by mouth daily.    Marland Kitchen apixaban (ELIQUIS) 5 MG TABS tablet Take 5 mg by mouth in the morning and at bedtime.    . Ascorbic Acid (VITAMIN C) 1000 MG tablet Take 1,000 mg by mouth 2 (two) times daily.    Marland Kitchen atorvastatin (LIPITOR) 40 MG tablet Take 1 tablet (40 mg total) by mouth at bedtime. 90 tablet 3  . atorvastatin (LIPITOR) 80 MG tablet Take 80 mg by mouth daily.    . Carboxymethylcellulose Sod PF (REFRESH PLUS) 0.5 % SOLN Administer 2 drops to both eyes daily at 0600.    . carvedilol (COREG) 6.25 MG tablet Take 1 tablet (6.25 mg total) by mouth 2 (two) times daily. 180 tablet 3  . Cholecalciferol (VITAMIN D PO) Take 1 capsule by mouth daily.    . clopidogrel (PLAVIX) 75 MG tablet Take 75 mg by mouth daily.    . cyanocobalamin 1000 MCG tablet Take 1,000 mcg by mouth daily.    . finasteride (PROSCAR) 5 MG tablet Take 5 mg by mouth daily.    . fluticasone (FLONASE) 50 MCG/ACT nasal spray 2 sprays by Each Nare route Two (2) times a day.    . furosemide (LASIX) 80 MG tablet Take 1 tablet (80 mg total) by mouth 2 (two) times daily. 180 tablet 3  . isosorbide mononitrate (IMDUR) 30 MG 24 hr tablet Take 1 tablet (30 mg total)  by mouth daily. 30 tablet 3  . meclizine (ANTIVERT) 25 MG tablet Take 25-50 mg by mouth every 8 (eight) hours.    . Mometasone Furoate (ASMANEX HFA IN) Inhale 2 puffs into the lungs 2 (two) times daily.    . nitroGLYCERIN (NITROSTAT) 0.4 MG SL tablet Place 0.4 mg under the tongue every 5 (five) minutes as needed for chest pain.    . pantoprazole (PROTONIX) 40 MG tablet Take 40 mg by mouth daily.    . Probiotic Product (PROBIOTIC DAILY PO) Take 1 capsule by mouth daily.    Marland Kitchen spironolactone (ALDACTONE) 25 MG tablet Take 25 mg by mouth daily. 1/2 TAB    . tamsulosin (FLOMAX) 0.4 MG CAPS capsule Take 0.4 mg by mouth daily.    Marland Kitchen telmisartan (MICARDIS) 20 MG tablet Take 20 mg by mouth daily. BID     No current facility-administered medications on file prior to visit.    Allergies:  No Known Allergies   Vitals Today's Vitals   08/09/20 1344  BP: (!) 134/56  Pulse: (!) 55  Weight: 257 lb (116.6 kg)  Height: 5\' 10"  (1.778 m)   Body mass index is 36.88 kg/m.  Physical Exam General: Obese pleasant elderly Caucasian male seated, in no evident distress Head: head normocephalic and atraumatic.   Neck: supple with no carotid or supraclavicular bruits Cardiovascular: regular rate and rhythm, no murmurs Musculoskeletal: no deformity Skin:  no rash/petichiae Vascular:  Normal pulses all extremities  Neurologic Exam Mental Status: Awake and fully alert. Fluent speech and language.  Oriented to place and time. Recent and remote memory intact. Attention span, concentration and fund of knowledge appropriate. Mood and affect appropriate.  Cranial Nerves: Pupils equal, briskly reactive to light. Extraocular movements full without nystagmus. Visual fields show partial right homonymous hemianopsia to confrontation. Hearing intact. Facial sensation intact. Face, tongue, palate moves normally and symmetrically.  Motor: Normal bulk and tone. Normal strength in all tested extremity muscles. Sensory.:  Decreased pinprick bilateral hand phalanges with intact sensation starting at metacarpal joint.  Decreased vibratory and pinprick sensation bilateral lower extremities distally Coordination: Rapid alternating movements normal in all extremities. Finger-to-nose and heel-to-shin performed accurately bilaterally. Gait and Station: Arises from chair without difficulty. Stance is normal. Gait demonstrates normal stride length and balance without use of assistive device Reflexes: 1+ and symmetric. Toes downgoing.       ASSESSMENT/PLAN: 79 year old Caucasian male with embolic left occipital infarct in January 2021 likely secondary to paroxysmal atrial fibrillation with multiple vascular risk factors of obesity, hypertension, hyperlipidemia, coronary artery disease, prior tobacco use, extracranial carotid stenosis and atrial fibrillation.  Residual deficits of right peripheral impairment and imbalance.  He also endorses bilateral fingertip numbness/tingling and bilateral distal lower extremity numbness/tingling.  Also complains of occasional dizziness/lightheadedness sensation   1. Occipital stroke (HCC) -Residual right homonymous hemianopia stable without worsening.  Reports occasional difficulty or delayed focusing occasionally causing dizziness and decreased vision in low light conditions.  Advised continued routine follow-up with ophthalmology and possibly consider referral to neuro-ophthalmology.  He declines interest at this time -Continue Eliquis, clopidogrel and atorvastatin for secondary stroke prevention and history of atrial fibrillation and CAD -Continue to follow closely with PCP for aggressive stroke risk factor management   2. Bilateral carotid artery stenosis VAS US CAROTID 02/13/2020 showed right ICA 60 to 79% stenosis and left ICA 40 to 59% stenosis Remains asymptomatic Ordered: - VAS US CAROTID for surveillance monitoring  3. PAF (paroxysmal atrial fibrillation) (HCC) Continue  Eliquis and routine follow-up with cardiology for monitoring management  4. Essential hypertension BP goal<130/90 Stable monitored by PCP/cardiology  5. Hyperlipidemia LDL goal <70 Stable on atorvastatin  6. Bilateral hand numbness Unknown etiology CT cervical by PCP showed mild degenerative changes otherwise unremarkable Discussed further evaluation with EMG/NCV but he declines interest at this time  7. Dizziness, nonspecific Unknown etiology Improvement after spironolactone dosage adjustment Advised to continue to follow with cardiology/PCP    Follow-up in 6 months or call earlier if needed  CC:  [facility] provider: Dr. Cameron Sprang, Evlyn Courier., MD    I spent 40 minutes of face-to-face and non-face-to-face time with patient.  This included previsit chart review, lab review, study review, order entry, electronic health record documentation, patient education regarding above conditions and answered all questions to patient satisfaction  Ihor Austin, [facility]  [facility] [address] [place], [place] [address]  Phone [phone] Fax [phone] Note: This document was prepared with digital dictation and possible smart phrase technology. Any transcriptional errors that result from this process are unintentional.

## 2020-08-26 ENCOUNTER — Other Ambulatory Visit: Payer: Self-pay

## 2020-08-26 ENCOUNTER — Ambulatory Visit (HOSPITAL_COMMUNITY)
Admission: RE | Admit: 2020-08-26 | Discharge: 2020-08-26 | Disposition: A | Discharge status: Home / Self Care | Payer: Medicare HMO | Source: Ambulatory Visit | Attending: Adult Health | Admitting: Adult Health

## 2020-08-26 ENCOUNTER — Telehealth: Payer: Self-pay

## 2020-08-26 DIAGNOSIS — I6523 Occlusion and stenosis of bilateral carotid arteries: Secondary | ICD-10-CM

## 2020-08-26 NOTE — Telephone Encounter (Signed)
Pt verified by name and DOB, results given per provider, pt voiced understanding all question answered. 

## 2020-08-26 NOTE — Telephone Encounter (Signed)
-----   Message from Ihor Austin, NP sent at 08/26/2020  3:31 PM EST ----- Please advise patient that recent carotid ultrasound showed stable appearance compared to prior ultrasound 6 months ago.  Continue current treatment regimen.

## 2021-02-08 ENCOUNTER — Encounter: Payer: Self-pay | Admitting: Adult Health

## 2021-02-08 ENCOUNTER — Ambulatory Visit: Payer: Medicare HMO | Admitting: Adult Health

## 2021-02-08 VITALS — BP 141/64 | HR 58 | Ht 70.0 in | Wt 262.0 lb

## 2021-02-08 DIAGNOSIS — I480 Paroxysmal atrial fibrillation: Secondary | ICD-10-CM

## 2021-02-08 DIAGNOSIS — I10 Essential (primary) hypertension: Secondary | ICD-10-CM

## 2021-02-08 DIAGNOSIS — I6523 Occlusion and stenosis of bilateral carotid arteries: Secondary | ICD-10-CM | POA: Diagnosis not present

## 2021-02-08 DIAGNOSIS — T887XXA Unspecified adverse effect of drug or medicament, initial encounter: Secondary | ICD-10-CM

## 2021-02-08 DIAGNOSIS — E785 Hyperlipidemia, unspecified: Secondary | ICD-10-CM

## 2021-02-08 DIAGNOSIS — I639 Cerebral infarction, unspecified: Secondary | ICD-10-CM

## 2021-02-08 NOTE — Progress Notes (Signed)
[facility] [address] [place]. [place] [address] [phone]       STROKE FOLLOW UP NOTE  Mr. Vidur Knust Date of Birth:  11-Mar-1941 Medical Record Number:  [id number]   Referring MD:  Alexandria Lodge Reason for Referral:  stroke  Chief complaint: Chief Complaint  Patient presents with  . Follow-up    TR alone PT states for the last month or so he has just been feeling awful. Hands have been numb, cant catch his breath, cant lift his arms, feels like "muscle is coming off the bone" in arms and legs.       HPI:  Today, 02/08/2021, Mr. Cecena returns for 6 month stroke follow up unaccompanied  He has been stable from stroke standpoint with residual visual impairment and denies new stroke/TIA symptoms.  Reports compliance on Eliquis, Plavix and atorvastatin without associated side effects.  Blood pressure today 141/64.  His greatest concern today is in regards to muscle and joint pain, shortness of breath, increased appetite, weight gain, runny nose, and watery eyes which he believes is related to taking alogliptin which was started back in February by the [place] for diabetes. He reports after 1 month of starting, he started to experience aches and pains in his hands and slowly progressed up into his shoulder where greatly limited his range of motion and functional mobility.  He apparently has attempted to reach out to the [place] in regards to these concerns but had great difficulty contacting them. He self discontinued alogliptin approximately 1 week ago and has already been noticing some improvement.  He does have follow-up with his PCP Dr. Shary Decamp next week to discuss further.  Routinely monitors glucose levels at home via [facility] typically ranging 130s-150s.   No further concerns at this time    History provided for reference purposes only Update 08/09/2020 JM: Mr. Wenke returns for 12-month stroke follow-up unaccompanied.  Stable since prior visit with  residual visual impairment which has been stable without worsening.  He is routinely followed by ophthalmology and reports stable exam.  Prior complaints of dizziness have improved since decreasing spironolactone dosage.  Denies new or worsening stroke/TIA symptoms.  Remains on Eliquis and Plavix without bleeding or bruising and atorvastatin without myalgias.  Blood pressure today 134/56.  Routinely monitors at home which has been stable.  Reports prior frequent episodes of hypotension but this is since subsided after spironolactone dosage adjustment.  Reports compliance with CPAP.  Continued bilateral fingertip numbness which is chronic and stable without worsening.  PCP obtain CT cervical and CT head which was largely unremarkable.  No further concerns at this time.  Update 02/03/2020 JM: Mr. Mcconathy returns for follow-up regarding left occipital stroke in 09/2019.  Residual deficits of right peripheral visual impairment and occasional imbalance and dizziness which has been stable without worsening.  He also reports gradual worsening of bilateral fingertip numbness with decreased sensation and occasional bilateral foot numbness/tingling.  Long history of working as a Curator as well as daily walking on concrete floors.  Denies any pain associated.  Continues on Eliquis and clopidogrel for secondary stroke prevention, history of atrial fibrillation and significant CAD.  Also continues on atorvastatin for secondary stroke prevention.  Blood pressure today 150/68. Monitors routinely at home 130-150/60s with recent adjustments made to antihypertensives.  He does report dizziness occasionally upon position changes or randomly throughout the day.  Endorses ongoing compliance with CPAP for OSA management.  No further concerns at this time.  Initial visit  11/06/2019 Dr. Pearlean Brownie: Mr. Riches is 80 year old Caucasian male with past medical history of hypertension, coronary artery disease status post MI with stent placement,  hyperlipidemia, asthma, osteoarthritis congestive heart failure and paroxysmal A. fib as well as diabetes who presented to [facility] on 10/09/2019 with sudden onset of loss of vision.  He states that he was working on a car and bent down and all of a sudden he noticed that for the moment he could not see completely out of both eyes and subsequently noticed that part of the vision came back on the left side.  The did not regain vision on the right peripheral field of vision for quite some time.  He was seen by ophthalmologist who found his blood pressure to be significantly elevated and referred him to the hospital for stroke work-up.  Denies any accompanying nausea, vomiting, slurred speech, extremity weakness or numbness.  CT scan of the head in the emergency room was unremarkable subsequently MRI was obtained which showed a left occipital cortical small infarct.  Extracranial carotid ultrasound showed 50 to 69% bilateral patient apparently was seen by neurology but I do not have those notes to review.  Cardiology was consulted recommended he be started on Eliquis.  Aspirin was discontinued and he was continued on Plavix and Eliquis due to his significant coronary artery disease.  Patient states he has been driving well without any accidents but is aware that his right-sided peripheral vision is restricted yet.  He states his blood pressure continues to be elevated and is working with the primary care physician to get it under control.  He was tolerating Eliquis and but was recently switched to Pradaxa by the [place] as they do not carry Eliquis.  Is tolerating it well without bleeding or bruising.  He is is aware that he needs to been on diet and exercise regularly and lose weight.  He has no complaints today.  Stenosis and 2D echo was unremarkable.  He is ex-smoker quit 18 years ago.  He denies prior history of strokes, TIAs or significant neurological problems.  ROS:   14 system review of systems is  positive for those listed in HPI and all other systems negative  PMH:  Past Medical History:  Diagnosis Date  . Acute cardioembolic stroke (HCC) 01/14/2020  . Acute respiratory failure (HCC) 01/14/2020  . Anemia, chronic disease 10/03/2016  . Angina pectoris (HCC) 02/04/2015  . Aortic calcification (HCC) 10/11/2015  . Atherosclerosis of both carotid arteries 10/13/2019   Formatting of this note might be different from the original. 09/2019  . Atherosclerotic heart disease of native coronary artery without angina pectoris 02/03/2015   Formatting of this note might be different from the original. 1. Multivessel PCI in Nov 2014 with 3 Xience stents to proximal LAD, LCF and RCA, EF 45% 2. 02/03/15 with PCI and resolute stennt LCF and PTCA of ostial RCA stenosis, EF 60% for unstable angina Formatting of this note might be different from the original. Last cath 04/2019. Hx PCI. 70% lesions. Medical management.  . Benign essential HTN 02/03/2015  . Benign prostatic hyperplasia with urinary hesitancy 08/17/2016  . BPH (benign prostatic hyperplasia) 08/17/2016  . CAD (coronary artery disease) 10/11/2015   1. Multivessel PCI in Nov 2014 with 3 Xience stents to proximal LAD, LCF and RCA, EF 45% 2. 02/03/15 with PCI and resolute stennt LCF and PTCA of ostial RCA stenosis, EF 60% for unstable angina  . Cerebral infarction (HCC) 03/25/2020  . Chest pain at  rest 02/04/2015  . Chronic diastolic heart failure (HCC) 04/16/2018  . Chronic low back pain 03/25/2020  . Chronic rhinitis 03/25/2020  . Class 2 severe obesity due to excess calories with serious comorbidity and body mass index (BMI) of 36.0 to 36.9 in adult (HCC) 10/18/2015  . COPD, mild (HCC) 10/13/2019  . Coronary artery disease of bypass graft of native heart with stable angina pectoris (HCC) 04/28/2019  . Coronary disease 10/11/2015   1. Multivessel PCI in Nov 2014 with 3 Xience stents to proximal LAD, LCF and RCA, EF 45% 2. 02/03/15 with PCI and resolute stennt LCF and  PTCA of ostial RCA stenosis, EF 60% for unstable angina (b.) 04/25/19 cath with mod RCA and mod LAD disease recommended for med therapy with normal EF  . Degeneration of lumbar intervertebral disc 08/17/2016  . Degenerative lumbar disc 08/17/2016  . Dizziness of unknown etiology 04/26/2020  . Essential hypertension 10/11/2015  . Generalized osteoarthritis 03/25/2020  . High risk medication use 10/15/2015  . Hyperlipidemia 02/03/2015  . Hypertensive heart disease with heart failure (HCC) 02/03/2015  . Hypoxia 10/11/2015  . LAE (left atrial enlargement) 10/11/2015  . Left ventricular hypertrophy 10/11/2015  . Localized edema 11/14/2016  . Lumbar radiculopathy 01/14/2020  . Malaise and fatigue 10/11/2015  . Nausea & vomiting 01/14/2020  . Obesity 01/14/2020  . Obesity (BMI 30.0-34.9) 10/18/2015  . Obstructive sleep apnea syndrome 04/10/2018   Wears CPAP  . Oliguria 01/14/2020  . Osteoarthritis 10/11/2015  . Other ill-defined and unknown causes of morbidity and mortality 10/18/2015  . Other specified abnormal findings of blood chemistry 01/14/2020  . Paroxysmal atrial fibrillation (HCC) 03/02/2015   Brief during cardiac cath, not anticoagulated  . Pneumonia 01/14/2020  . Prediabetes 10/11/2015  . Presence of intraocular lens 03/25/2020  . Primary osteoarthritis, right ankle and foot 03/25/2020  . Psychosexual dysfunction with inhibited sexual excitement 04/26/2020  . Pulmonary fibrosis (HCC) 10/11/2015  . Radicular pain of both lower extremities 01/14/2020  . Rotaviral gastroenteritis 01/14/2020  . S/P lumbar spinal fusion 01/14/2020  . Screening for prostate cancer 04/10/2018   Chooses no further testing.  . Seasonal allergic rhinitis due to pollen 01/23/2020  . Sinus bradycardia 04/28/2019  . Status post percutaneous transluminal coronary angioplasty 04/26/2020  . Status post stroke 10/13/2019   Formatting of this note might be different from the original. 10/10/2019  . Stroke (HCC)   . TIA (transient ischemic attack) 01/14/2020   . Vitamin B12 deficiency 10/03/2016  . Wound infection after surgery 01/14/2020    Social History:  Social History   Socioeconomic History  . Marital status: Married    Spouse name: Not on file  . Number of children: Not on file  . Years of education: Not on file  . Highest education level: Not on file  Occupational History  . Not on file  Tobacco Use  . Smoking status: Former Smoker    Types: Cigarettes  . Smokeless tobacco: Former Neurosurgeon    Types: Engineer, drilling  . Vaping Use: Never used  Substance and Sexual Activity  . Alcohol use: Yes    Comment: occ  . Drug use: Not Currently  . Sexual activity: Not on file  Other Topics Concern  . Not on file  Social History Narrative  . Not on file   Social Determinants of Health   Financial Resource Strain: Not on file  Food Insecurity: Not on file  Transportation Needs: Not on file  Physical Activity: Not on file  Stress: Not on file  Social Connections: Not on file  Intimate Partner Violence: Not on file    Medications:   Current Outpatient Medications on File Prior to Visit  Medication Sig Dispense Refill  . acetaminophen (TYLENOL) 325 MG tablet Take 650 mg by mouth every 6 (six) hours as needed.    Marland Kitchen albuterol (VENTOLIN HFA) 108 (90 Base) MCG/ACT inhaler Inhale 2 puffs into the lungs every 6 (six) hours as needed.    Marland Kitchen amLODipine (NORVASC) 10 MG tablet Take 10 mg by mouth daily.    Marland Kitchen apixaban (ELIQUIS) 5 MG TABS tablet Take 5 mg by mouth in the morning and at bedtime.    . Ascorbic Acid (VITAMIN C) 1000 MG tablet Take 1,000 mg by mouth 2 (two) times daily.    Marland Kitchen atorvastatin (LIPITOR) 40 MG tablet Take 1 tablet (40 mg total) by mouth at bedtime. 90 tablet 3  . Carboxymethylcellulose Sod PF 0.5 % SOLN Administer 2 drops to both eyes daily at 0600.    . carvedilol (COREG) 6.25 MG tablet Take 1 tablet (6.25 mg total) by mouth 2 (two) times daily. 180 tablet 3  . Cholecalciferol (VITAMIN D PO) Take 1 capsule by mouth  daily.    . clopidogrel (PLAVIX) 75 MG tablet Take 75 mg by mouth daily.    . cyanocobalamin 1000 MCG tablet Take 1,000 mcg by mouth daily.    . finasteride (PROSCAR) 5 MG tablet Take 5 mg by mouth daily.    . fluticasone (FLONASE) 50 MCG/ACT nasal spray 2 sprays by Each Nare route Two (2) times a day.    . Mometasone Furoate (ASMANEX HFA IN) Inhale 2 puffs into the lungs 2 (two) times daily.    . nitroGLYCERIN (NITROSTAT) 0.4 MG SL tablet Place 0.4 mg under the tongue every 5 (five) minutes as needed for chest pain.    . pantoprazole (PROTONIX) 40 MG tablet Take 40 mg by mouth daily.    . Probiotic Product (PROBIOTIC DAILY PO) Take 1 capsule by mouth daily.    Marland Kitchen spironolactone (ALDACTONE) 25 MG tablet Take 25 mg by mouth daily. 1/2 TAB    . tamsulosin (FLOMAX) 0.4 MG CAPS capsule Take 0.4 mg by mouth daily.    Marland Kitchen telmisartan (MICARDIS) 20 MG tablet Take 20 mg by mouth daily. BID    . furosemide (LASIX) 80 MG tablet Take 1 tablet (80 mg total) by mouth 2 (two) times daily. 180 tablet 3  . isosorbide mononitrate (IMDUR) 30 MG 24 hr tablet Take 1 tablet (30 mg total) by mouth daily. 30 tablet 3   No current facility-administered medications on file prior to visit.    Allergies:  No Known Allergies   Vitals Today's Vitals   02/08/21 1435  BP: (!) 141/64  Pulse: (!) 58  SpO2: 96%  Weight: 262 lb (118.8 kg)  Height: 5\' 10"  (1.778 m)   Body mass index is 37.59 kg/m.  Physical Exam General: Obese pleasant elderly Caucasian male seated, in no evident distress Head: head normocephalic and atraumatic.   Neck: supple with no carotid or supraclavicular bruits Cardiovascular: regular rate and rhythm, no murmurs Musculoskeletal: no deformity Skin:  no rash/petichiae Vascular:  Normal pulses all extremities  Neurologic Exam Mental Status: Awake and fully alert. Fluent speech and language.  Oriented to place and time. Recent and remote memory intact. Attention span, concentration and fund  of knowledge appropriate. Mood and affect appropriate.  Cranial Nerves: Pupils equal, briskly reactive to light. Extraocular movements  full without nystagmus. Visual fields show partial (lower>upper) right homonymous hemianopsia to confrontation. Hearing intact. Facial sensation intact. Face, tongue, palate moves normally and symmetrically.  Motor: Normal bulk and tone. Normal strength in all tested extremity muscles. Sensory.:  Decreased vibratory and pinprick sensation bilateral lower extremities distally Coordination: Rapid alternating movements normal in all extremities. Finger-to-nose and heel-to-shin performed accurately bilaterally. Gait and Station: Arises from chair without difficulty. Stance is normal. Gait demonstrates normal stride length and balance without use of assistive device Reflexes: 1+ and symmetric. Toes downgoing.       ASSESSMENT/PLAN: 80 year old Caucasian male with embolic left occipital infarct in January 2021 likely secondary to paroxysmal atrial fibrillation with multiple vascular risk factors of obesity, hypertension, hyperlipidemia, coronary artery disease, prior tobacco use, extracranial carotid stenosis and atrial fibrillation.     1. Occipital stroke (HCC) -Residual right homonymous hemianopia stable -does not interfere with daily activity or functioning -Continue Eliquis, clopidogrel and atorvastatin for secondary stroke prevention and history of atrial fibrillation and CAD -Continue to follow closely with PCP for aggressive stroke risk factor management including HTN with BP goal 130/90 and HLD with LDL goal<70  2. Bilateral carotid artery stenosis -Carotid Doppler 02/13/2020 and 08/26/2020 showed right ICA 60 to 79% stenosis and left ICA 40 to 59% stenosis -Remains asymptomatic - request to consolidate care with cardiology for ongoing monitoring and management  3. PAF (paroxysmal atrial fibrillation) (HCC) -Continue Eliquis and routine follow-up with  cardiology for monitoring management  4.  Suspected medication side effect -Greatest concern today in regards to muscle and joint pain since starting alogliptin - he has since discontinued and plans to further discuss with PCP next week.  Also reports SOB, increased appetite, weight gain, runny nose and watery eyes - he plans on further discussing with PCP as well as cardiology follow-up next week for further evaluation    Advised pt that as he has been stable and routinely follows with PCP, [facility] and [facility] can consolidate care with current providers for secondary stroke prevention management but advised to call with any questions or concerns in the future   CC:  [facility] provider: Dr. Cameron SprangSethi Grisso, Evlyn CourierGreg A., MD     Ihor AustinJessica McCue, AGNP-BC  [facility] [address] [place], [place] [id number]  Phone [phone] Fax [phone] Note: This document was prepared with digital dictation and possible smart phrase technology. Any transcriptional errors that result from this process are unintentional.

## 2021-02-08 NOTE — Patient Instructions (Signed)
Continue clopidogrel 75 mg daily and Eliquis (apixaban) daily  and atorvastatin  for secondary stroke prevention  Continue to follow up with PCP regarding cholesterol, blood pressure and diabetes management  Maintain strict control of hypertension with blood pressure goal below 130/90, diabetes with hemoglobin A1c goal below 7% and cholesterol with LDL cholesterol (bad cholesterol) goal below 70 mg/dL.        Thank you for coming to see [place] at [facility]. I hope we have been able to provide you high quality care today.  You may receive a patient satisfaction survey over the next few weeks. We would appreciate your feedback and comments so that we may continue to improve ourselves and the health of our patients.

## 2021-02-09 NOTE — Progress Notes (Signed)
Cardiology Office Note:    Date:  02/10/2021   ID:  Jimmy Chapman, DOB 1940/10/10, MRN [id number]  PCP:  Gordan Payment., MD  Cardiologist:  Norman Herrlich, MD    Referring MD: Gordan Payment., MD    ASSESSMENT:    1. Myopathy   2. Coronary artery disease of bypass graft of native heart with stable angina pectoris (HCC)   3. Paroxysmal atrial fibrillation (HCC)   4. Chronic anticoagulation   5. Hypertensive heart disease with heart failure (HCC)   6. Mixed hyperlipidemia    PLAN:    In order of problems listed above:  1. Clinically I think he has myopathy myositis due to statin he is stopped today and if not improved in the short-term I think he would benefit seeing rheumatology and perhaps even a muscle biopsy.  At times high intensity statin skin unmask myopathy. 2. Stable CAD continue medical therapy but stop statin 3. Stable no recurrence 4. Continue his anticoagulant he can withdraw that if he needs a muscle biopsy 5. Looks mildly decompensated I will increase his diuretic 6. For now no lipid-lowering treatment   Next appointment: 3 months   Medication Adjustments/Labs and Tests Ordered: Current medicines are reviewed at length with the patient today.  Concerns regarding medicines are outlined above.  No orders of the defined types were placed in this encounter.  No orders of the defined types were placed in this encounter.   Chief Complaint  Patient presents with  . Follow-up  . Coronary Artery Disease  . Atrial Fibrillation  . Congestive Heart Failure    History of Present Illness:    Jimmy Chapman is a 80 y.o. male with a hx of coronary artery disease paroxysmal atrial fibrillation chronic anticoagulation hyperlipidemia and hypertensive heart disease with heart failure.  He was last seen 07/20/2020.  He underwent left heart catheterization 04/24/2019 normal ejection fraction 60% with moderate stenosis of the right coronary artery and distal left anterior  descending recommended for medical therapy.  Subsequently in January 2021 he had visual loss and a left occipital stroke.  Compliance with diet, lifestyle and medications: Yes  Is seen in routine follow-up. He complains bitterly of muscle pain and weakness progressive since February he has trouble buckling his pants he cannot lift his arms up above the shoulder and struggles to get out of a chair associated with muscle pain and weakness. He had labs drawn this morning with his PCP he takes high intensity statin I think he is having myositis/myopathy I told him to stop now.  If unimproved in the short-term 1 we could send him to a rheumatologist and at times statins can unmask underlying myopathies. He is more short of breath he has edema will increase his diuretic. He uses CPAP and is not having orthopnea No chest pain palpitation or syncope. Past Medical History:  Diagnosis Date  . Acute cardioembolic stroke (HCC) 01/14/2020  . Acute respiratory failure (HCC) 01/14/2020  . Anemia, chronic disease 10/03/2016  . Angina pectoris (HCC) 02/04/2015  . Aortic calcification (HCC) 10/11/2015  . Atherosclerosis of both carotid arteries 10/13/2019   Formatting of this note might be different from the original. 09/2019  . Atherosclerotic heart disease of native coronary artery without angina pectoris 02/03/2015   Formatting of this note might be different from the original. 1. Multivessel PCI in Nov 2014 with 3 Xience stents to proximal LAD, LCF and RCA, EF 45% 2. 02/03/15 with PCI and resolute stennt LCF and PTCA  of ostial RCA stenosis, EF 60% for unstable angina Formatting of this note might be different from the original. Last cath 04/2019. Hx PCI. 70% lesions. Medical management.  . Benign essential HTN 02/03/2015  . Benign prostatic hyperplasia with urinary hesitancy 08/17/2016  . BPH (benign prostatic hyperplasia) 08/17/2016  . CAD (coronary artery disease) 10/11/2015   1. Multivessel PCI in Nov 2014 with 3  Xience stents to proximal LAD, LCF and RCA, EF 45% 2. 02/03/15 with PCI and resolute stennt LCF and PTCA of ostial RCA stenosis, EF 60% for unstable angina  . Cerebral infarction (HCC) 03/25/2020  . Chest pain at rest 02/04/2015  . Chronic diastolic heart failure (HCC) 04/16/2018  . Chronic low back pain 03/25/2020  . Chronic rhinitis 03/25/2020  . Class 2 severe obesity due to excess calories with serious comorbidity and body mass index (BMI) of 36.0 to 36.9 in adult (HCC) 10/18/2015  . COPD, mild (HCC) 10/13/2019  . Coronary artery disease of bypass graft of native heart with stable angina pectoris (HCC) 04/28/2019  . Coronary disease 10/11/2015   1. Multivessel PCI in Nov 2014 with 3 Xience stents to proximal LAD, LCF and RCA, EF 45% 2. 02/03/15 with PCI and resolute stennt LCF and PTCA of ostial RCA stenosis, EF 60% for unstable angina (b.) 04/25/19 cath with mod RCA and mod LAD disease recommended for med therapy with normal EF  . Degeneration of lumbar intervertebral disc 08/17/2016  . Degenerative lumbar disc 08/17/2016  . Dizziness of unknown etiology 04/26/2020  . Essential hypertension 10/11/2015  . Generalized osteoarthritis 03/25/2020  . High risk medication use 10/15/2015  . Hyperlipidemia 02/03/2015  . Hypertensive heart disease with heart failure (HCC) 02/03/2015  . Hypoxia 10/11/2015  . LAE (left atrial enlargement) 10/11/2015  . Left ventricular hypertrophy 10/11/2015  . Localized edema 11/14/2016  . Lumbar radiculopathy 01/14/2020  . Malaise and fatigue 10/11/2015  . Nausea & vomiting 01/14/2020  . Obesity 01/14/2020  . Obesity (BMI 30.0-34.9) 10/18/2015  . Obstructive sleep apnea syndrome 04/10/2018   Wears CPAP  . Oliguria 01/14/2020  . Osteoarthritis 10/11/2015  . Other ill-defined and unknown causes of morbidity and mortality 10/18/2015  . Other specified abnormal findings of blood chemistry 01/14/2020  . Paroxysmal atrial fibrillation (HCC) 03/02/2015   Brief during cardiac cath, not anticoagulated  .  Pneumonia 01/14/2020  . Prediabetes 10/11/2015  . Presence of intraocular lens 03/25/2020  . Primary osteoarthritis, right ankle and foot 03/25/2020  . Psychosexual dysfunction with inhibited sexual excitement 04/26/2020  . Pulmonary fibrosis (HCC) 10/11/2015  . Radicular pain of both lower extremities 01/14/2020  . Rotaviral gastroenteritis 01/14/2020  . S/P lumbar spinal fusion 01/14/2020  . Screening for prostate cancer 04/10/2018   Chooses no further testing.  . Seasonal allergic rhinitis due to pollen 01/23/2020  . Sinus bradycardia 04/28/2019  . Status post percutaneous transluminal coronary angioplasty 04/26/2020  . Status post stroke 10/13/2019   Formatting of this note might be different from the original. 10/10/2019  . Stroke (HCC)   . TIA (transient ischemic attack) 01/14/2020  . Vitamin B12 deficiency 10/03/2016  . Wound infection after surgery 01/14/2020    Past Surgical History:  Procedure Laterality Date  . APPENDECTOMY    . CATARACT EXTRACTION, BILATERAL    . CORONARY ANGIOPLASTY WITH STENT PLACEMENT    . HERNIA REPAIR      Current Medications: Current Meds  Medication Sig  . acetaminophen (TYLENOL) 325 MG tablet Take 650 mg by mouth every 6 (six)  hours as needed.  Marland Kitchen albuterol (VENTOLIN HFA) 108 (90 Base) MCG/ACT inhaler Inhale 2 puffs into the lungs every 6 (six) hours as needed.  Marland Kitchen amLODipine (NORVASC) 10 MG tablet Take 10 mg by mouth daily.  Marland Kitchen apixaban (ELIQUIS) 5 MG TABS tablet Take 5 mg by mouth in the morning and at bedtime.  . Ascorbic Acid (VITAMIN C) 1000 MG tablet Take 1,000 mg by mouth 2 (two) times daily.  Marland Kitchen atorvastatin (LIPITOR) 40 MG tablet Take 1 tablet (40 mg total) by mouth at bedtime.  . Carboxymethylcellulose Sod PF 0.5 % SOLN Administer 2 drops to both eyes daily at 0600.  . carvedilol (COREG) 6.25 MG tablet Take 1 tablet (6.25 mg total) by mouth 2 (two) times daily.  . Cholecalciferol (VITAMIN D PO) Take 1 capsule by mouth daily.  . clopidogrel (PLAVIX) 75 MG  tablet Take 75 mg by mouth daily.  . cyanocobalamin 1000 MCG tablet Take 1,000 mcg by mouth daily.  . finasteride (PROSCAR) 5 MG tablet Take 5 mg by mouth daily.  . fluticasone (FLONASE) 50 MCG/ACT nasal spray 2 sprays by Each Nare route Two (2) times a day.  . Mometasone Furoate (ASMANEX HFA IN) Inhale 2 puffs into the lungs 2 (two) times daily.  . nitroGLYCERIN (NITROSTAT) 0.4 MG SL tablet Place 0.4 mg under the tongue every 5 (five) minutes as needed for chest pain.  . pantoprazole (PROTONIX) 40 MG tablet Take 40 mg by mouth daily.  . Probiotic Product (PROBIOTIC DAILY PO) Take 1 capsule by mouth daily.  Marland Kitchen spironolactone (ALDACTONE) 25 MG tablet Take 25 mg by mouth daily. 1/2 TAB  . tamsulosin (FLOMAX) 0.4 MG CAPS capsule Take 0.4 mg by mouth daily.  Marland Kitchen telmisartan (MICARDIS) 20 MG tablet Take 20 mg by mouth 2 (two) times daily.     Allergies:   Alogliptin   Social History   Socioeconomic History  . Marital status: Married    Spouse name: Not on file  . Number of children: Not on file  . Years of education: Not on file  . Highest education level: Not on file  Occupational History  . Not on file  Tobacco Use  . Smoking status: Former Smoker    Types: Cigarettes  . Smokeless tobacco: Former Neurosurgeon    Types: Engineer, drilling  . Vaping Use: Never used  Substance and Sexual Activity  . Alcohol use: Yes    Comment: occ  . Drug use: Not Currently  . Sexual activity: Not on file  Other Topics Concern  . Not on file  Social History Narrative  . Not on file   Social Determinants of Health   Financial Resource Strain: Not on file  Food Insecurity: Not on file  Transportation Needs: Not on file  Physical Activity: Not on file  Stress: Not on file  Social Connections: Not on file     Family History: The patient's family history includes Heart attack in his father; Hypertension in his mother. ROS:   Please see the history of present illness.    All other systems reviewed and  are negative.  EKGs/Labs/Other Studies Reviewed:    The following studies were reviewed today:    Recent Labs: 07/20/2020: BUN 17; Creatinine, Ser 1.00; NT-Pro BNP 32; Potassium 3.7; Sodium 144  Recent Lipid Panel    Component Value Date/Time   CHOL 162 07/20/2020 0848   TRIG 247 (H) 07/20/2020 0848   HDL 45 07/20/2020 0848   CHOLHDL 3.6 07/20/2020 0848  LDLCALC 77 07/20/2020 0848    Physical Exam:    VS:  BP 120/60 (BP Location: Right Arm, Patient Position: Sitting, Cuff Size: Normal)   Pulse 66   Ht 5\' 10"  (1.778 m)   Wt 258 lb 3.2 oz (117.1 kg)   SpO2 96%   BMI 37.05 kg/m     Wt Readings from Last 3 Encounters:  02/10/21 258 lb 3.2 oz (117.1 kg)  02/08/21 262 lb (118.8 kg)  08/09/20 257 lb (116.6 kg)     GEN:  Well nourished, well developed in no acute distress HEENT: Normal NECK: No JVD; No carotid bruits LYMPHATICS: No lymphadenopathy CARDIAC: RRR, no murmurs, rubs, gallops RESPIRATORY:  Clear to auscultation without rales, wheezing or rhonchi  ABDOMEN: Soft, non-tender, non-distended MUSCULOSKELETAL: 1-2+ peripheral lower extremity pitting edema; No deformity  SKIN: Warm and dry NEUROLOGIC:  Alert and oriented x 3 PSYCHIATRIC:  Normal affect    Signed, Norman HerrlichBrian Kacin Dancy, MD  02/10/2021 2:00 PM    [facility]

## 2021-02-10 ENCOUNTER — Ambulatory Visit: Payer: Medicare HMO | Admitting: Cardiology

## 2021-02-10 ENCOUNTER — Other Ambulatory Visit: Payer: Self-pay

## 2021-02-10 ENCOUNTER — Encounter: Payer: Self-pay | Admitting: Cardiology

## 2021-02-10 VITALS — BP 120/60 | HR 66 | Ht 70.0 in | Wt 258.2 lb

## 2021-02-10 DIAGNOSIS — I25708 Atherosclerosis of coronary artery bypass graft(s), unspecified, with other forms of angina pectoris: Secondary | ICD-10-CM

## 2021-02-10 DIAGNOSIS — E782 Mixed hyperlipidemia: Secondary | ICD-10-CM

## 2021-02-10 DIAGNOSIS — G729 Myopathy, unspecified: Secondary | ICD-10-CM

## 2021-02-10 DIAGNOSIS — I110 Hypertensive heart disease with heart failure: Secondary | ICD-10-CM

## 2021-02-10 DIAGNOSIS — I480 Paroxysmal atrial fibrillation: Secondary | ICD-10-CM

## 2021-02-10 DIAGNOSIS — Z7901 Long term (current) use of anticoagulants: Secondary | ICD-10-CM | POA: Diagnosis not present

## 2021-02-10 MED ORDER — FUROSEMIDE 80 MG PO TABS: ORAL_TABLET | ORAL | 3 refills | Status: DC

## 2021-02-10 NOTE — Patient Instructions (Signed)
Medication Instructions:  Your physician has recommended you make the following change in your medication:  STOP: Atorvastatin  INCREASE: Furosemide 80 mg take one tablet by mouth daily in the morning, 1/2 tablet by mouth daily at 12, and one tablet by mouth daily in the evening. *If you need a refill on your cardiac medications before your next appointment, please call your pharmacy*   Lab Work: None If you have labs (blood work) drawn today and your tests are completely normal, you will receive your results only by: Marland Kitchen MyChart Message (if you have MyChart) OR . A paper copy in the mail If you have any lab test that is abnormal or we need to change your treatment, we will call you to review the results.   Testing/Procedures: None   Follow-Up: At [facility], you and your health needs are our priority.  As part of our continuing mission to provide you with exceptional heart care, we have created designated Provider Care Teams.  These Care Teams include your primary Cardiologist (physician) and Advanced Practice Providers (APPs -  Physician Assistants and Nurse Practitioners) who all work together to provide you with the care you need, when you need it.  We recommend signing up for the patient portal called "MyChart".  Sign up information is provided on this After Visit Summary.  MyChart is used to connect with patients for Virtual Visits (Telemedicine).  Patients are able to view lab/test results, encounter notes, upcoming appointments, etc.  Non-urgent messages can be sent to your provider as well.   To learn more about what you can do with MyChart, go to [email or web address].    Your next appointment:   3 month(s)  The format for your next appointment:   In Person  Provider:   Norman Herrlich, MD   Other Instructions

## 2021-02-14 NOTE — Progress Notes (Signed)
I agree with the above plan 

## 2021-05-17 NOTE — Progress Notes (Signed)
Cardiology Office Note:    Date:  05/18/2021   ID:  Jimmy Chapman, DOB [birth year], MRN [id number]  PCP:  Gordan Payment., MD  Cardiologist:  Norman Herrlich, MD    Referring MD: Gordan Payment., MD    ASSESSMENT:    1. Mixed hyperlipidemia   2. Statin intolerance   3. Paroxysmal atrial fibrillation (HCC)   4. Chronic anticoagulation   5. Hypertensive heart disease with heart failure (HCC)    PLAN:    In order of problems listed above:  I would not rechallenge him with a statin or bempedoic acid Initiated PCSK9 inhibitor.  I think his myopathy is beyond his statin therapy and I still think he should be seen by a rheumatologist for primary myopathy. Stable atrial fibrillation continue anticoagulation Discontinue amlodipine continue other medications   Next appointment: 6 months   Medication Adjustments/Labs and Tests Ordered: Current medicines are reviewed at length with the patient today.  Concerns regarding medicines are outlined above.  No orders of the defined types were placed in this encounter.  No orders of the defined types were placed in this encounter.   Chief Complaint  Patient presents with   Follow-up   Hyperlipidemia    History of Present Illness:    Jimmy Chapman is a 80 y.o. male with a hx of CAD paroxysmal atrial fibrillation chronic anticoagulation hyperlipidemia hypertensive heart disease with heart failure diastolic stroke January 2021 occipital with visual loss last seen 02/10/2021 with myopathy related to statin.  Left heart catheterization 04/24/2019 showed EF of 60% with moderate stenosis of the right coronary artery and distal left anterior descending arteries treated medically.  Compliance with diet, lifestyle and medications: Yes  He continues to have marked muscle pain and weakness proximally he has had some steroid injections he is taking prednisone it has not really helped.  I advised him to see a rheumatologist he may have a primary myopathy  he may need a muscle biopsy and he declines.  Terms of lipid-lowering therapy, initiate a PCSK9 inhibitor.  He remains short of breath blood pressure is low my office but is followed at home and was in the 120/80 range.  His amlodipine with his peripheral edema.  He has had no angina  Recent labs service 602 2022 [facility] PCP Creatinine 1.03 sodium 141 potassium 3.8 normal liver function test hemoglobin 12.6 Past Medical History:  Diagnosis Date   Acute cardioembolic stroke (HCC) 01/14/2020   Acute respiratory failure (HCC) 01/14/2020   Anemia, chronic disease 10/03/2016   Angina pectoris (HCC) 02/04/2015   Aortic calcification (HCC) 10/11/2015   Atherosclerosis of both carotid arteries 10/13/2019   Formatting of this note might be different from the original. 09/2019   Atherosclerotic heart disease of native coronary artery without angina pectoris 02/03/2015   Formatting of this note might be different from the original. 1. Multivessel PCI in Nov 2014 with 3 Xience stents to proximal LAD, LCF and RCA, EF 45% 2. 02/03/15 with PCI and resolute stennt LCF and PTCA of ostial RCA stenosis, EF 60% for unstable angina Formatting of this note might be different from the original. Last cath 04/2019. Hx PCI. 70% lesions. Medical management.   Benign essential HTN 02/03/2015   Benign prostatic hyperplasia with urinary hesitancy 08/17/2016   BPH (benign prostatic hyperplasia) 08/17/2016   CAD (coronary artery disease) 10/11/2015   1. Multivessel PCI in Nov 2014 with 3 Xience stents to proximal LAD, LCF and RCA, EF 45% 2. 02/03/15 with  PCI and resolute stennt LCF and PTCA of ostial RCA stenosis, EF 60% for unstable angina   Cerebral infarction (HCC) 03/25/2020   Chest pain at rest 02/04/2015   Chronic diastolic heart failure (HCC) 04/16/2018   Chronic low back pain 03/25/2020   Chronic rhinitis 03/25/2020   Class 2 severe obesity due to excess calories with serious comorbidity and body mass index (BMI) of 36.0 to  36.9 in adult [facility](HCC) 10/18/2015   COPD, mild (HCC) 10/13/2019   Coronary artery disease of bypass graft of native heart with stable angina pectoris (HCC) 04/28/2019   Coronary disease 10/11/2015   1. Multivessel PCI in Nov 2014 with 3 Xience stents to proximal LAD, LCF and RCA, EF 45% 2. 02/03/15 with PCI and resolute stennt LCF and PTCA of ostial RCA stenosis, EF 60% for unstable angina (b.) 04/25/19 cath with mod RCA and mod LAD disease recommended for med therapy with normal EF   Degeneration of lumbar intervertebral disc 08/17/2016   Degenerative lumbar disc 08/17/2016   Dizziness of unknown etiology 04/26/2020   Essential hypertension 10/11/2015   Generalized osteoarthritis 03/25/2020   High risk medication use 10/15/2015   Hyperlipidemia 02/03/2015   Hypertensive heart disease with heart failure (HCC) 02/03/2015   Hypoxia 10/11/2015   LAE (left atrial enlargement) 10/11/2015   Left ventricular hypertrophy 10/11/2015   Localized edema 11/14/2016   Lumbar radiculopathy 01/14/2020   Malaise and fatigue 10/11/2015   Nausea & vomiting 01/14/2020   Obesity 01/14/2020   Obesity (BMI 30.0-34.9) 10/18/2015   Obstructive sleep apnea syndrome 04/10/2018   Wears CPAP   Oliguria 01/14/2020   Osteoarthritis 10/11/2015   Other ill-defined and unknown causes of morbidity and mortality 10/18/2015   Other specified abnormal findings of blood chemistry 01/14/2020   Paroxysmal atrial fibrillation (HCC) 03/02/2015   Brief during cardiac cath, not anticoagulated   Pneumonia 01/14/2020   Prediabetes 10/11/2015   Presence of intraocular lens 03/25/2020   Primary osteoarthritis, right ankle and foot 03/25/2020   Psychosexual dysfunction with inhibited sexual excitement 04/26/2020   Pulmonary fibrosis (HCC) 10/11/2015   Radicular pain of both lower extremities 01/14/2020   Rotaviral gastroenteritis 01/14/2020   S/P lumbar spinal fusion 01/14/2020   Screening for prostate cancer 04/10/2018   Chooses no further testing.   Seasonal allergic rhinitis  due to pollen 01/23/2020   Sinus bradycardia 04/28/2019   Status post percutaneous transluminal coronary angioplasty 04/26/2020   Status post stroke 10/13/2019   Formatting of this note might be different from the original. 10/10/2019   Stroke [facility](HCC)    TIA (transient ischemic attack) 01/14/2020   Vitamin B12 deficiency 10/03/2016   Wound infection after surgery 01/14/2020    Past Surgical History:  Procedure Laterality Date   APPENDECTOMY     CATARACT EXTRACTION, BILATERAL     CORONARY ANGIOPLASTY WITH STENT PLACEMENT     HERNIA REPAIR      Current Medications: Current Meds  Medication Sig   acetaminophen (TYLENOL) 325 MG tablet Take 650 mg by mouth every 6 (six) hours as needed for moderate pain.   albuterol (VENTOLIN HFA) 108 (90 Base) MCG/ACT inhaler Inhale 2 puffs into the lungs every 6 (six) hours as needed for shortness of breath.   amLODipine (NORVASC) 10 MG tablet Take 10 mg by mouth daily.   apixaban (ELIQUIS) 5 MG TABS tablet Take 5 mg by mouth in the morning and at bedtime.   Ascorbic Acid (VITAMIN C) 1000 MG tablet Take 1,000 mg by mouth 2 (two)  times daily.   Carboxymethylcellulose Sod PF 0.5 % SOLN Administer 2 drops to both eyes daily at 0600.   carvedilol (COREG) 12.5 MG tablet Take 0.5 tablets by mouth 2 (two) times daily.   cholecalciferol (VITAMIN D3) 25 MCG (1000 UNIT) tablet Take 1,000 Units by mouth daily.   clopidogrel (PLAVIX) 75 MG tablet Take 75 mg by mouth daily.   cyanocobalamin 1000 MCG tablet Take 1,000 mcg by mouth daily.   finasteride (PROSCAR) 5 MG tablet Take 5 mg by mouth daily.   fluticasone (FLONASE) 50 MCG/ACT nasal spray 2 sprays by Each Nare route Two (2) times a day.   furosemide (LASIX) 80 MG tablet Take 80 mg by mouth 2 (two) times daily.   isosorbide mononitrate (IMDUR) 30 MG 24 hr tablet Take 1 tablet (30 mg total) by mouth daily.   metFORMIN (GLUCOPHAGE) 500 MG tablet Take 500 mg by mouth 2 (two) times daily.   Mometasone Furoate (ASMANEX HFA  IN) Inhale 2 puffs into the lungs 2 (two) times daily.   nitroGLYCERIN (NITROSTAT) 0.4 MG SL tablet Place 0.4 mg under the tongue every 5 (five) minutes as needed for chest pain.   pantoprazole (PROTONIX) 40 MG tablet Take 40 mg by mouth daily.   Probiotic Product (PROBIOTIC DAILY PO) Take 1 capsule by mouth daily.   spironolactone (ALDACTONE) 25 MG tablet Take 12.5 mg by mouth daily. 1/2 TAB   tamsulosin (FLOMAX) 0.4 MG CAPS capsule Take 0.8 mg by mouth daily.   telmisartan (MICARDIS) 20 MG tablet Take 20 mg by mouth 2 (two) times daily.     Allergies:   Alogliptin, Atorvastatin, Gemfibrozil, Niacin, Pravastatin, and Simvastatin   Social History   Socioeconomic History   Marital status: Married    Spouse name: Not on file   Number of children: Not on file   Years of education: Not on file   Highest education level: Not on file  Occupational History   Not on file  Tobacco Use   Smoking status: Former    Types: Cigarettes   Smokeless tobacco: Former    Types: Associate Professor Use: Never used  Substance and Sexual Activity   Alcohol use: Yes    Comment: occ   Drug use: Not Currently   Sexual activity: Not on file  Other Topics Concern   Not on file  Social History Narrative   Not on file   Social Determinants of Health   Financial Resource Strain: Not on file  Food Insecurity: Not on file  Transportation Needs: Not on file  Physical Activity: Not on file  Stress: Not on file  Social Connections: Not on file     Family History: The patient's family history includes Heart attack in his father; Hypertension in his mother. ROS:   Please see the history of present illness.    All other systems reviewed and are negative.  EKGs/Labs/Other Studies Reviewed:    The following studies were reviewed today:    Recent Labs: 07/20/2020: BUN 17; Creatinine, Ser 1.00; NT-Pro BNP 32; Potassium 3.7; Sodium 144  Recent Lipid Panel    Component Value Date/Time   CHOL  162 07/20/2020 0848   TRIG 247 (H) 07/20/2020 0848   HDL 45 07/20/2020 0848   CHOLHDL 3.6 07/20/2020 0848   LDLCALC 77 07/20/2020 0848    Physical Exam:    VS:  BP (!) 92/50 (BP Location: Right Arm, Patient Position: Sitting, Cuff Size: Normal)   Pulse 76  Ht 5\' 10"  (1.778 m)   Wt 250 lb (113.4 kg)   SpO2 94%   BMI 35.87 kg/m     Wt Readings from Last 3 Encounters:  05/18/21 250 lb (113.4 kg)  02/10/21 258 lb 3.2 oz (117.1 kg)  02/08/21 262 lb (118.8 kg)     GEN: He has marked proximal muscle weakness well nourished, well developed in no acute distress HEENT: Normal NECK: No JVD; No carotid bruits LYMPHATICS: No lymphadenopathy CARDIAC: RRR, no murmurs, rubs, gallops RESPIRATORY:  Clear to auscultation without rales, wheezing or rhonchi  ABDOMEN: Soft, non-tender, non-distended MUSCULOSKELETAL:  No edema; No deformity  SKIN: Warm and dry NEUROLOGIC:  Alert and oriented x 3 PSYCHIATRIC:  Normal affect    Signed, 02/10/21, MD  05/18/2021 1:54 PM    [facility]

## 2021-05-18 ENCOUNTER — Ambulatory Visit: Payer: Medicare HMO | Admitting: Cardiology

## 2021-05-18 ENCOUNTER — Encounter: Payer: Self-pay | Admitting: Cardiology

## 2021-05-18 ENCOUNTER — Other Ambulatory Visit: Payer: Self-pay

## 2021-05-18 VITALS — BP 92/50 | HR 76 | Ht 70.0 in | Wt 250.0 lb

## 2021-05-18 DIAGNOSIS — Z789 Other specified health status: Secondary | ICD-10-CM | POA: Diagnosis not present

## 2021-05-18 DIAGNOSIS — I110 Hypertensive heart disease with heart failure: Secondary | ICD-10-CM

## 2021-05-18 DIAGNOSIS — I480 Paroxysmal atrial fibrillation: Secondary | ICD-10-CM | POA: Diagnosis not present

## 2021-05-18 DIAGNOSIS — Z7901 Long term (current) use of anticoagulants: Secondary | ICD-10-CM | POA: Diagnosis not present

## 2021-05-18 DIAGNOSIS — E782 Mixed hyperlipidemia: Secondary | ICD-10-CM | POA: Diagnosis not present

## 2021-05-18 NOTE — Patient Instructions (Signed)
Medication Instructions:  Your physician has recommended you make the following change in your medication:  STOP: Amlodipine *If you need a refill on your cardiac medications before your next appointment, please call your pharmacy*   Lab Work: None If you have labs (blood work) drawn today and your tests are completely normal, you will receive your results only by: . MyChart Message (if you have MyChart) OR . A paper copy in the mail If you have any lab test that is abnormal or we need to change your treatment, we will call you to review the results.   Testing/Procedures: None   Follow-Up: At [facility], you and your health needs are our priority.  As part of our continuing mission to provide you with exceptional heart care, we have created designated Provider Care Teams.  These Care Teams include your primary Cardiologist (physician) and Advanced Practice Providers (APPs -  Physician Assistants and Nurse Practitioners) who all work together to provide you with the care you need, when you need it.  We recommend signing up for the patient portal called "MyChart".  Sign up information is provided on this After Visit Summary.  MyChart is used to connect with patients for Virtual Visits (Telemedicine).  Patients are able to view lab/test results, encounter notes, upcoming appointments, etc.  Non-urgent messages can be sent to your provider as well.   To learn more about what you can do with MyChart, go to [email or web address].    Your next appointment:   6 month(s)  The format for your next appointment:   In Person  Provider:   Brian Munley, MD   Other Instructions   

## 2021-05-30 ENCOUNTER — Telehealth: Payer: Self-pay | Admitting: Pharmacist

## 2021-05-30 ENCOUNTER — Other Ambulatory Visit: Payer: Self-pay

## 2021-05-30 ENCOUNTER — Ambulatory Visit: Payer: Medicare HMO | Admitting: Pharmacist

## 2021-05-30 DIAGNOSIS — E782 Mixed hyperlipidemia: Secondary | ICD-10-CM | POA: Diagnosis not present

## 2021-05-30 MED ORDER — PRALUENT 75 MG/ML ~~LOC~~ SOAJ: 1.0000 "pen " | SUBCUTANEOUS | 11 refills | Status: DC

## 2021-05-30 MED ORDER — PRALUENT 75 MG/ML ~~LOC~~ SOAJ
1.0000 | SUBCUTANEOUS | 11 refills | Status: DC
Start: 2021-05-30 — End: 2022-05-19

## 2021-05-30 NOTE — Telephone Encounter (Signed)
PA for Praluent approved through 09/10/21. Pt made aware. He already picked up. He will go to [place] office for labs in 2 months

## 2021-05-30 NOTE — Patient Instructions (Addendum)
Try to cut back on the juice, bisquet and pasta (carbs and sugars)  I will submit a prior authorization for either Repatha and Praluent to [employer].  I will also [facility] to see if they might cover it  Call me at [phone] with any questions

## 2021-05-30 NOTE — Progress Notes (Signed)
Patient ID: Jimmy Chapman                 DOB: 02-21-1941                    MRN: [id number]     HPI: Jimmy Chapman is a 80 y.o. male patient referred to lipid clinic by Dr. Dulce Sellar. PMH is significant for CAD paroxysmal atrial fibrillation chronic anticoagulation hyperlipidemia hypertensive heart disease with heart failure diastolic stroke January 2021 occipital with visual loss. He was seen in June by Dr. Dulce Sellar. Reported severe muscle pain and weakness. Concerned about myositis/myopathy Dr. Dulce Sellar asked him to stop his statin. He was asked to see a rheumatologist.   Patient presents today to lipid clinic. He states he has seen doctor. Had a steroid injection that helped for 5 weeks, but not still cant raise his arms above his head. Also has pain in this thighs and numbness in his fingers. He thought he was seeing a rheumatologist. He actually is seeing a sports medicine doctor. Has an MRI on Wed.   He uses the [place] for medications. But sometimes he has to fill medications using his medicare plan because they do not send him the medications in time or they wont cover them.   States he has been working to decrease his portions and cut back on carbs.  Current Medications: none Intolerances: atorvastatin 40mg  daily (muscle pains), rosuvastatin (leg pain/cramps) Risk Factors: stroke, DM LDL goal: <55  Diet:  Breakfast: 1/2 banana 3-4 cups of decaf coffee with cream, eggs bacon, 1/2 bisquet + gravy Lunch: spaghetti, fruit, salad/assorted nuts, couple tangerines, pizza and salad, soup and sandwich Dinner: pork loin, bisquet Drink: milk, gingerale (seldom drink soda), water, low Na V8 (2 jugs per week)  Exercise:   Family History:  Family History  Problem Relation Age of Onset   Hypertension Mother    Heart attack Father      Social History: whiskey - 1 drink per day or 2 beers, Former smoker  Labs:05/18/21 TC 233, TG 248, HDL 39, LDL 163  Past Medical History:  Diagnosis Date   Acute  cardioembolic stroke (HCC) 01/14/2020   Acute respiratory failure (HCC) 01/14/2020   Anemia, chronic disease 10/03/2016   Angina pectoris (HCC) 02/04/2015   Aortic calcification (HCC) 10/11/2015   Atherosclerosis of both carotid arteries 10/13/2019   Formatting of this note might be different from the original. 09/2019   Atherosclerotic heart disease of native coronary artery without angina pectoris 02/03/2015   Formatting of this note might be different from the original. 1. Multivessel PCI in Nov 2014 with 3 Xience stents to proximal LAD, LCF and RCA, EF 45% 2. 02/03/15 with PCI and resolute stennt LCF and PTCA of ostial RCA stenosis, EF 60% for unstable angina Formatting of this note might be different from the original. Last cath 04/2019. Hx PCI. 70% lesions. Medical management.   Benign essential HTN 02/03/2015   Benign prostatic hyperplasia with urinary hesitancy 08/17/2016   BPH (benign prostatic hyperplasia) 08/17/2016   CAD (coronary artery disease) 10/11/2015   1. Multivessel PCI in Nov 2014 with 3 Xience stents to proximal LAD, LCF and RCA, EF 45% 2. 02/03/15 with PCI and resolute stennt LCF and PTCA of ostial RCA stenosis, EF 60% for unstable angina   Cerebral infarction (HCC) 03/25/2020   Chest pain at rest 02/04/2015   Chronic diastolic heart failure (HCC) 04/16/2018   Chronic low back pain 03/25/2020   Chronic rhinitis 03/25/2020  Class 2 severe obesity due to excess calories with serious comorbidity and body mass index (BMI) of 36.0 to 36.9 in adult [facility]) 10/18/2015   COPD, mild (HCC) 10/13/2019   Coronary artery disease of bypass graft of native heart with stable angina pectoris (HCC) 04/28/2019   Coronary disease 10/11/2015   1. Multivessel PCI in Nov 2014 with 3 Xience stents to proximal LAD, LCF and RCA, EF 45% 2. 02/03/15 with PCI and resolute stennt LCF and PTCA of ostial RCA stenosis, EF 60% for unstable angina (b.) 04/25/19 cath with mod RCA and mod LAD disease recommended for med therapy with  normal EF   Degeneration of lumbar intervertebral disc 08/17/2016   Degenerative lumbar disc 08/17/2016   Dizziness of unknown etiology 04/26/2020   Essential hypertension 10/11/2015   Generalized osteoarthritis 03/25/2020   High risk medication use 10/15/2015   Hyperlipidemia 02/03/2015   Hypertensive heart disease with heart failure (HCC) 02/03/2015   Hypoxia 10/11/2015   LAE (left atrial enlargement) 10/11/2015   Left ventricular hypertrophy 10/11/2015   Localized edema 11/14/2016   Lumbar radiculopathy 01/14/2020   Malaise and fatigue 10/11/2015   Nausea & vomiting 01/14/2020   Obesity 01/14/2020   Obesity (BMI 30.0-34.9) 10/18/2015   Obstructive sleep apnea syndrome 04/10/2018   Wears CPAP   Oliguria 01/14/2020   Osteoarthritis 10/11/2015   Other ill-defined and unknown causes of morbidity and mortality 10/18/2015   Other specified abnormal findings of blood chemistry 01/14/2020   Paroxysmal atrial fibrillation (HCC) 03/02/2015   Brief during cardiac cath, not anticoagulated   Pneumonia 01/14/2020   Prediabetes 10/11/2015   Presence of intraocular lens 03/25/2020   Primary osteoarthritis, right ankle and foot 03/25/2020   Psychosexual dysfunction with inhibited sexual excitement 04/26/2020   Pulmonary fibrosis (HCC) 10/11/2015   Radicular pain of both lower extremities 01/14/2020   Rotaviral gastroenteritis 01/14/2020   S/P lumbar spinal fusion 01/14/2020   Screening for prostate cancer 04/10/2018   Chooses no further testing.   Seasonal allergic rhinitis due to pollen 01/23/2020   Sinus bradycardia 04/28/2019   Status post percutaneous transluminal coronary angioplasty 04/26/2020   Status post stroke 10/13/2019   Formatting of this note might be different from the original. 10/10/2019   Stroke [facility])    TIA (transient ischemic attack) 01/14/2020   Vitamin B12 deficiency 10/03/2016   Wound infection after surgery 01/14/2020    Current Outpatient Medications on File Prior to Visit  Medication Sig Dispense Refill    acetaminophen (TYLENOL) 325 MG tablet Take 650 mg by mouth every 6 (six) hours as needed for moderate pain.     albuterol (VENTOLIN HFA) 108 (90 Base) MCG/ACT inhaler Inhale 2 puffs into the lungs every 6 (six) hours as needed for shortness of breath.     apixaban (ELIQUIS) 5 MG TABS tablet Take 5 mg by mouth in the morning and at bedtime.     Ascorbic Acid (VITAMIN C) 1000 MG tablet Take 1,000 mg by mouth 2 (two) times daily.     Carboxymethylcellulose Sod PF 0.5 % SOLN Administer 2 drops to both eyes daily at 0600.     carvedilol (COREG) 12.5 MG tablet Take 0.5 tablets by mouth 2 (two) times daily.     cholecalciferol (VITAMIN D3) 25 MCG (1000 UNIT) tablet Take 1,000 Units by mouth daily.     clopidogrel (PLAVIX) 75 MG tablet Take 75 mg by mouth daily.     cyanocobalamin 1000 MCG tablet Take 1,000 mcg by mouth daily.  finasteride (PROSCAR) 5 MG tablet Take 5 mg by mouth daily.     fluticasone (FLONASE) 50 MCG/ACT nasal spray 2 sprays by Each Nare route Two (2) times a day.     furosemide (LASIX) 80 MG tablet Take 80 mg by mouth 2 (two) times daily.     isosorbide mononitrate (IMDUR) 30 MG 24 hr tablet Take 1 tablet (30 mg total) by mouth daily. 30 tablet 3   metFORMIN (GLUCOPHAGE) 500 MG tablet Take 500 mg by mouth 2 (two) times daily.     Mometasone Furoate (ASMANEX HFA IN) Inhale 2 puffs into the lungs 2 (two) times daily.     nitroGLYCERIN (NITROSTAT) 0.4 MG SL tablet Place 0.4 mg under the tongue every 5 (five) minutes as needed for chest pain.     pantoprazole (PROTONIX) 40 MG tablet Take 40 mg by mouth daily.     Probiotic Product (PROBIOTIC DAILY PO) Take 1 capsule by mouth daily.     spironolactone (ALDACTONE) 25 MG tablet Take 12.5 mg by mouth daily. 1/2 TAB     tamsulosin (FLOMAX) 0.4 MG CAPS capsule Take 0.8 mg by mouth daily.     telmisartan (MICARDIS) 20 MG tablet Take 20 mg by mouth 2 (two) times daily.     No current facility-administered medications on file prior to visit.     Allergies  Allergen Reactions   Alogliptin Other (See Comments)    Muscle soreness/aches   Atorvastatin     Other reaction(s): Myalgias (intolerance)   Gemfibrozil    Niacin    Pravastatin     Other reaction(s): Muscle pain   Simvastatin     Other reaction(s): Muscle pain    Assessment/Plan:  1. Hyperlipidemia - LDL is above goal of <55. TG above goal of <150. TG are historically above goal. His A1C is well controlled at 5.9 on metformin. We discussed the importance of decreasing his carbs/sugar intake. Patient has muscle pains/cramps with rosuvastatin. Pain did not improve off of atorvastatin. Needs to see rheumatology to rule out myopathy. I advised the doctor he is seeing is a sports medicine MD. If MRI does not show anything, he should speak with PCP about referral to Rheumatology.  Discussed PCSK9i. Reviewed dosing, injection technique and cost. Will attempt to get from [place]- but this usually proves to be challenging. I will also attempt PA to his [employer] plan. If cost becomes an issue, patient will reach out to [place]. Repeat lipid panel in 2-3 months. At that time we may need to consider tx for his TG.  Thank you,   Olene Floss, Pharm.D, BCPS, CPP [facility]  [address] [place], [place] [address]  Phone: [phone]; Fax: [phone]

## 2021-06-17 ENCOUNTER — Other Ambulatory Visit: Payer: Self-pay | Admitting: Orthopedic Surgery

## 2021-06-17 DIAGNOSIS — M25511 Pain in right shoulder: Secondary | ICD-10-CM

## 2021-06-17 DIAGNOSIS — M25512 Pain in left shoulder: Secondary | ICD-10-CM

## 2021-06-28 ENCOUNTER — Ambulatory Visit
Admission: RE | Admit: 2021-06-28 | Discharge: 2021-06-28 | Disposition: A | Discharge status: Home / Self Care | Payer: Medicare HMO | Source: Ambulatory Visit | Attending: Orthopedic Surgery | Admitting: Orthopedic Surgery

## 2021-06-28 ENCOUNTER — Other Ambulatory Visit: Payer: Self-pay

## 2021-06-28 DIAGNOSIS — M25511 Pain in right shoulder: Secondary | ICD-10-CM

## 2021-06-28 DIAGNOSIS — M25512 Pain in left shoulder: Secondary | ICD-10-CM

## 2021-07-18 ENCOUNTER — Telehealth: Payer: Self-pay | Admitting: Cardiology

## 2021-07-18 NOTE — Telephone Encounter (Signed)
Patient came into the office just now requesting to be seen by Dr. Dulce Sellar. I triaged the patient and advised that he be evaluated in the emergency room at this time. He has several different complains. The main complaint is the fluid build up in his arms. Shoulders, and abdomen. He is noticeably short of breath when just trying to carry on a normal conversation with me.   He has other complains of a shoulder injury that he has been following up with sports medicine about and pain in all of his joints per the patient.   The patient states that he will be evaluated in the emergency room.

## 2021-07-18 NOTE — Telephone Encounter (Signed)
Pt c/o swelling: STAT is pt has developed SOB within 24 hours  How much weight have you gained and in what time span? 5LBS EVERY OTHER DAY  If swelling, where is the swelling located? ARMS/SHOULDERS AND BACK OF BOTH LEGS AND RIBS  Are you currently taking a fluid pill? YES  Are you currently SOB? NO  Do you have a log of your daily weights (if so, list)? PT IS ALWAYS SOB  Have you gained 3 pounds in a day or 5 pounds in a week? 5LBS EVERY OTHER DAY  Have you traveled recently? NO  PT IS ON HIS WAY TO THE OFFICE BECAUSE HE CAN NOT GET ANYONE TO ANSWER HIS QUESTIONS  PT HAS BEEN SWOLLEN SINCE APRIL

## 2021-08-17 ENCOUNTER — Other Ambulatory Visit: Payer: Self-pay | Admitting: Pharmacist

## 2021-08-17 ENCOUNTER — Telehealth: Payer: Self-pay | Admitting: Pharmacist

## 2021-08-17 DIAGNOSIS — E782 Mixed hyperlipidemia: Secondary | ICD-10-CM

## 2021-08-17 NOTE — Telephone Encounter (Signed)
Called pt and LVM per DPR that we would like him to stop by the [facility] office for fasting lipid labs.

## 2021-08-19 NOTE — Telephone Encounter (Signed)
Pt returned call asking what our message was. Relayed below message for pt to have fasting labs checked at [facility] office. He stated he would go on Monday.

## 2021-08-24 LAB — LIPID PANEL
Chol/HDL Ratio: 3.4 ratio (ref 0.0–5.0)
Cholesterol, Total: 134 mg/dL (ref 100–199)
HDL: 40 mg/dL (ref >39)
LDL Chol Calc (NIH): 51 mg/dL (ref 0–99)
Triglycerides: 276 mg/dL — ABNORMAL HIGH (ref 0–149)
VLDL Cholesterol Cal: 43 mg/dL — ABNORMAL HIGH (ref 5–40)

## 2021-08-25 ENCOUNTER — Telehealth: Payer: Self-pay

## 2021-08-25 NOTE — Telephone Encounter (Signed)
Spoke with patient regarding results and recommendation.  Patient verbalizes understanding and is agreeable to plan of care. Advised patient to call back with any issues or concerns.  

## 2021-08-25 NOTE — Telephone Encounter (Signed)
Left message on patients voicemail to please return our call.   

## 2021-11-15 ENCOUNTER — Encounter: Payer: Self-pay | Admitting: Cardiology

## 2021-11-15 ENCOUNTER — Other Ambulatory Visit: Payer: Self-pay

## 2021-11-15 ENCOUNTER — Ambulatory Visit: Payer: Medicare HMO | Admitting: Cardiology

## 2021-11-15 VITALS — BP 100/56 | HR 68 | Ht 70.0 in | Wt 243.0 lb

## 2021-11-15 DIAGNOSIS — I25708 Atherosclerosis of coronary artery bypass graft(s), unspecified, with other forms of angina pectoris: Secondary | ICD-10-CM

## 2021-11-15 DIAGNOSIS — E782 Mixed hyperlipidemia: Secondary | ICD-10-CM | POA: Diagnosis not present

## 2021-11-15 DIAGNOSIS — I480 Paroxysmal atrial fibrillation: Secondary | ICD-10-CM | POA: Diagnosis not present

## 2021-11-15 DIAGNOSIS — Z7901 Long term (current) use of anticoagulants: Secondary | ICD-10-CM | POA: Diagnosis not present

## 2021-11-15 DIAGNOSIS — I110 Hypertensive heart disease with heart failure: Secondary | ICD-10-CM | POA: Diagnosis not present

## 2021-11-15 DIAGNOSIS — Z789 Other specified health status: Secondary | ICD-10-CM

## 2021-11-15 MED ORDER — NITROGLYCERIN 0.4 MG SL SUBL: 0.4000 mg | SUBLINGUAL_TABLET | SUBLINGUAL | 3 refills | Status: AC | PRN

## 2021-11-15 NOTE — Progress Notes (Signed)
[facility]   Cardiology Office Note:    Date:  11/15/2021   ID:  Jimmy Chapman, DOB 01-08-1941, MRN [id number]  PCP:  Gordan Payment., MD  Cardiologist:  Norman Herrlich, MD    Referring MD: Gordan Payment., MD    ASSESSMENT:    1. Paroxysmal atrial fibrillation (HCC)   2. Chronic anticoagulation   3. Hypertensive heart disease with heart failure (HCC)   4. Mixed hyperlipidemia   5. Statin intolerance   6. Coronary artery disease of bypass graft of native heart with stable angina pectoris (HCC)    PLAN:    In order of problems listed above:  Stable maintaining sinus rhythm continues beta-blocker and anticoagulant does not require an antiarrhythmic drug Improved continue his loop diuretic he has no fluid overload blood pressure at target including carvedilol diuretic ARB and and ARB recheck renal function proBNP Improved he is having severe muscle symptoms and weakness pain with statin is now on Praluent last lipid profile at target Stable CAD continue medical treatment having no angina [place] Heart Association class I including clopidogrel beta-blocker oral nitrate and PCSK9 inhibitor statin intolerant   Next appointment: 6 months   Medication Adjustments/Labs and Tests Ordered: Current medicines are reviewed at length with the patient today.  Concerns regarding medicines are outlined above.  Orders Placed This Encounter  Procedures   Comprehensive metabolic panel   Lipid panel   Pro b natriuretic peptide (BNP)   EKG 12-Lead   Meds ordered this encounter  Medications   nitroGLYCERIN (NITROSTAT) 0.4 MG SL tablet    Sig: Place 1 tablet (0.4 mg total) under the tongue every 5 (five) minutes as needed for chest pain.    Dispense:  25 tablet    Refill:  3       History of Present Illness:    Jimmy Chapman is a 81 y.o. male with a hx of CAD paroxysmal atrial fibrillation chronic anticoagulation hyperlipidemia hypertensive heart disease with  heart failure diastolic stroke January 2021 occipital with visual loss and myopathy related to statin.  Left heart catheterization 04/24/2019 showed EF of 60% with moderate stenosis of the right coronary artery and distal left anterior descending arteries treated medically.  He was last seen 05/18/2021 intolerant of both statin and bempedoic acid and was referred to lipid clinic for PCSK9 therapy.    Compliance with diet, lifestyle and medications: Yes  Very complicated visit His first concern is he feels I laid him down when he was having severe joint pain he apparently called our office several times did not get a phone call back and tried to walk in to be seen and was told that he should go to urgent care or his primary care physician. I explained to him that were cardiology practice. Overall he is doing better off statin having less joint and muscle pain he tells me he has diffuse gout and gouty arthritis.  His heart failure is improved he is short of breath with more than usual activities no edema orthopnea chest pain palpitation or syncope Past Medical History:  Diagnosis Date   Acute cardioembolic stroke (HCC) 01/14/2020   Acute respiratory failure (HCC) 01/14/2020   Anemia, chronic disease 10/03/2016   Angina pectoris (HCC) 02/04/2015   Aortic calcification (HCC) 10/11/2015   Atherosclerosis of both carotid arteries 10/13/2019   Formatting of this note might be different from the original. 09/2019   Atherosclerotic heart disease of native coronary artery without angina pectoris  02/03/2015   Formatting of this note might be different from the original. 1. Multivessel PCI in Nov 2014 with 3 Xience stents to proximal LAD, LCF and RCA, EF 45% 2. 02/03/15 with PCI and resolute stennt LCF and PTCA of ostial RCA stenosis, EF 60% for unstable angina Formatting of this note might be different from the original. Last cath 04/2019. Hx PCI. 70% lesions. Medical management.   Benign essential HTN 02/03/2015    Benign prostatic hyperplasia with urinary hesitancy 08/17/2016   BPH (benign prostatic hyperplasia) 08/17/2016   CAD (coronary artery disease) 10/11/2015   1. Multivessel PCI in Nov 2014 with 3 Xience stents to proximal LAD, LCF and RCA, EF 45% 2. 02/03/15 with PCI and resolute stennt LCF and PTCA of ostial RCA stenosis, EF 60% for unstable angina   Cerebral infarction (HCC) 03/25/2020   Chest pain at rest 02/04/2015   Chronic diastolic heart failure (HCC) 04/16/2018   Chronic low back pain 03/25/2020   Chronic rhinitis 03/25/2020   Class 2 severe obesity due to excess calories with serious comorbidity and body mass index (BMI) of 36.0 to 36.9 in adult [facility]) 10/18/2015   COPD, mild (HCC) 10/13/2019   Coronary artery disease of bypass graft of native heart with stable angina pectoris (HCC) 04/28/2019   Coronary disease 10/11/2015   1. Multivessel PCI in Nov 2014 with 3 Xience stents to proximal LAD, LCF and RCA, EF 45% 2. 02/03/15 with PCI and resolute stennt LCF and PTCA of ostial RCA stenosis, EF 60% for unstable angina (b.) 04/25/19 cath with mod RCA and mod LAD disease recommended for med therapy with normal EF   Degeneration of lumbar intervertebral disc 08/17/2016   Degenerative lumbar disc 08/17/2016   Dizziness of unknown etiology 04/26/2020   Essential hypertension 10/11/2015   Generalized osteoarthritis 03/25/2020   High risk medication use 10/15/2015   Hyperlipidemia 02/03/2015   Hypertensive heart disease with heart failure (HCC) 02/03/2015   Hypoxia 10/11/2015   LAE (left atrial enlargement) 10/11/2015   Left ventricular hypertrophy 10/11/2015   Localized edema 11/14/2016   Lumbar radiculopathy 01/14/2020   Malaise and fatigue 10/11/2015   Nausea & vomiting 01/14/2020   Obesity 01/14/2020   Obesity (BMI 30.0-34.9) 10/18/2015   Obstructive sleep apnea syndrome 04/10/2018   Wears CPAP   Oliguria 01/14/2020   Osteoarthritis 10/11/2015   Other ill-defined and unknown causes of morbidity and mortality 10/18/2015    Other specified abnormal findings of blood chemistry 01/14/2020   Paroxysmal atrial fibrillation (HCC) 03/02/2015   Brief during cardiac cath, not anticoagulated   Pneumonia 01/14/2020   Prediabetes 10/11/2015   Presence of intraocular lens 03/25/2020   Primary osteoarthritis, right ankle and foot 03/25/2020   Psychosexual dysfunction with inhibited sexual excitement 04/26/2020   Pulmonary fibrosis (HCC) 10/11/2015   Radicular pain of both lower extremities 01/14/2020   Rotaviral gastroenteritis 01/14/2020   S/P lumbar spinal fusion 01/14/2020   Screening for prostate cancer 04/10/2018   Chooses no further testing.   Seasonal allergic rhinitis due to pollen 01/23/2020   Sinus bradycardia 04/28/2019   Status post percutaneous transluminal coronary angioplasty 04/26/2020   Status post stroke 10/13/2019   Formatting of this note might be different from the original. 10/10/2019   Stroke [facility])    TIA (transient ischemic attack) 01/14/2020   Vitamin B12 deficiency 10/03/2016   Wound infection after surgery 01/14/2020    Past Surgical History:  Procedure Laterality Date   APPENDECTOMY     CATARACT EXTRACTION, BILATERAL  CORONARY ANGIOPLASTY WITH STENT PLACEMENT     HERNIA REPAIR      Current Medications: Current Meds  Medication Sig   acetaminophen (TYLENOL) 325 MG tablet Take 650 mg by mouth every 6 (six) hours as needed for moderate pain.   albuterol (VENTOLIN HFA) 108 (90 Base) MCG/ACT inhaler Inhale 2 puffs into the lungs every 6 (six) hours as needed for shortness of breath.   Alirocumab (PRALUENT) 75 MG/ML SOAJ Inject 1 pen into the skin every 14 (fourteen) days.   apixaban (ELIQUIS) 5 MG TABS tablet Take 5 mg by mouth in the morning and at bedtime.   Ascorbic Acid (VITAMIN C) 1000 MG tablet Take 500 mg by mouth 2 (two) times daily.   Carboxymethylcellulose Sod PF 0.5 % SOLN Administer 2 drops to both eyes daily at 0600.   carvedilol (COREG) 12.5 MG tablet Take 0.5 tablets by mouth 2 (two) times  daily.   cholecalciferol (VITAMIN D3) 25 MCG (1000 UNIT) tablet Take 1,000 Units by mouth daily.   clopidogrel (PLAVIX) 75 MG tablet Take 75 mg by mouth daily.   cyanocobalamin 1000 MCG tablet Take 1,000 mcg by mouth daily.   finasteride (PROSCAR) 5 MG tablet Take 5 mg by mouth daily.   fluticasone (FLONASE) 50 MCG/ACT nasal spray 2 sprays by Each Nare route Two (2) times a day.   furosemide (LASIX) 80 MG tablet Take 80 mg by mouth 2 (two) times daily.   metFORMIN (GLUCOPHAGE) 500 MG tablet Take 250 mg by mouth 2 (two) times daily.   Mometasone Furoate (ASMANEX HFA IN) Inhale 2 puffs into the lungs 2 (two) times daily.   pantoprazole (PROTONIX) 40 MG tablet Take 40 mg by mouth daily.   Probiotic Product (PROBIOTIC DAILY PO) Take 1 capsule by mouth daily.   spironolactone (ALDACTONE) 25 MG tablet Take 12.5 mg by mouth daily. 1/2 TAB   tamsulosin (FLOMAX) 0.4 MG CAPS capsule Take 0.8 mg by mouth daily.   telmisartan (MICARDIS) 20 MG tablet Take 20 mg by mouth 2 (two) times daily.   traMADol (ULTRAM) 50 MG tablet Take 50-100 mg by mouth every 6 (six) hours as needed.   [DISCONTINUED] nitroGLYCERIN (NITROSTAT) 0.4 MG SL tablet Place 0.4 mg under the tongue every 5 (five) minutes as needed for chest pain.     Allergies:   Alogliptin, Atorvastatin, Gemfibrozil, Niacin, Pravastatin, and Simvastatin   Social History   Socioeconomic History   Marital status: Married    Spouse name: Not on file   Number of children: Not on file   Years of education: Not on file   Highest education level: Not on file  Occupational History   Not on file  Tobacco Use   Smoking status: Former    Types: Cigarettes   Smokeless tobacco: Former    Types: Associate Professor Use: Never used  Substance and Sexual Activity   Alcohol use: Yes    Comment: occ   Drug use: Not Currently   Sexual activity: Not on file  Other Topics Concern   Not on file  Social History Narrative   Not on file   Social  Determinants of Health   Financial Resource Strain: Not on file  Food Insecurity: Not on file  Transportation Needs: Not on file  Physical Activity: Not on file  Stress: Not on file  Social Connections: Not on file     Family History: The patient's family history includes Heart attack in his father; Hypertension in  his mother. ROS:   Please see the history of present illness.    All other systems reviewed and are negative.  EKGs/Labs/Other Studies Reviewed:    The following studies were reviewed today:  EKG:  EKG ordered today and personally reviewed.  The ekg ordered today demonstrates sinus rhythm left axis deviation first-degree AV block consider old inferior apical MI unchanged from November 2021.  Recent Labs: No results found for requested labs within last 8760 hours.  Recent Lipid Panel    Component Value Date/Time   CHOL 134 08/24/2021 0817   TRIG 276 (H) 08/24/2021 0817   HDL 40 08/24/2021 0817   CHOLHDL 3.4 08/24/2021 0817   LDLCALC 51 08/24/2021 0817    Physical Exam:    VS:  BP (!) 100/56    Pulse 68    Ht 5\' 10"  (1.778 m)    Wt 243 lb (110.2 kg)    SpO2 96%    BMI 34.87 kg/m     Wt Readings from Last 3 Encounters:  11/15/21 243 lb (110.2 kg)  05/18/21 250 lb (113.4 kg)  02/10/21 258 lb 3.2 oz (117.1 kg)     GEN:  Well nourished, well developed in no acute distress HEENT: Normal NECK: No JVD; No carotid bruits LYMPHATICS: No lymphadenopathy CARDIAC: RRR, no murmurs, rubs, gallops RESPIRATORY:  Clear to auscultation without rales, wheezing or rhonchi  ABDOMEN: Soft, non-tender, non-distended MUSCULOSKELETAL:  No edema; No deformity  SKIN: Warm and dry NEUROLOGIC:  Alert and oriented x 3 PSYCHIATRIC:  Normal affect    Signed, Norman HerrlichBrian Ladina Shutters, MD  11/15/2021 4:40 PM    [facility]

## 2021-11-15 NOTE — Patient Instructions (Signed)
Medication Instructions:  ?Your physician recommends that you continue on your current medications as directed. Please refer to the Current Medication list given to you today. ? ?*If you need a refill on your cardiac medications before your next appointment, please call your pharmacy* ? ? ?Lab Work: ?Your physician recommends that you return for lab work in: Today for CMP, Lipid Panel and ProBnp ? ?If you have labs (blood work) drawn today and your tests are completely normal, you will receive your results only by: ?MyChart Message (if you have MyChart) OR ?A paper copy in the mail ?If you have any lab test that is abnormal or we need to change your treatment, we will call you to review the results. ? ? ?Testing/Procedures: ?NONE ? ? ?Follow-Up: ?At [facility], you and your health needs are our priority.  As part of our continuing mission to provide you with exceptional heart care, we have created designated Provider Care Teams.  These Care Teams include your primary Cardiologist (physician) and Advanced Practice Providers (APPs -  Physician Assistants and Nurse Practitioners) who all work together to provide you with the care you need, when you need it. ? ?We recommend signing up for the patient portal called "MyChart".  Sign up information is provided on this After Visit Summary.  MyChart is used to connect with patients for Virtual Visits (Telemedicine).  Patients are able to view lab/test results, encounter notes, upcoming appointments, etc.  Non-urgent messages can be sent to your provider as well.   ?To learn more about what you can do with MyChart, go to [email or web address].   ? ?Your next appointment:   ?6 month(s) ? ?The format for your next appointment:   ?In Person ? ?Provider:   ?Norman Herrlich, MD  ? ? ?Other Instructions ?  ?

## 2021-11-16 LAB — LIPID PANEL
Chol/HDL Ratio: 4.2 ratio (ref 0.0–5.0)
Cholesterol, Total: 134 mg/dL (ref 100–199)
HDL: 32 mg/dL — ABNORMAL LOW (ref >39)
LDL Chol Calc (NIH): 36 mg/dL (ref 0–99)
Triglycerides: 467 mg/dL — ABNORMAL HIGH (ref 0–149)
VLDL Cholesterol Cal: 66 mg/dL — ABNORMAL HIGH (ref 5–40)

## 2021-11-16 LAB — COMPREHENSIVE METABOLIC PANEL
ALT: 16 IU/L (ref 0–44)
AST: 20 IU/L (ref 0–40)
Albumin/Globulin Ratio: 2.4 — ABNORMAL HIGH (ref 1.2–2.2)
Albumin: 4.7 g/dL (ref 3.7–4.7)
Alkaline Phosphatase: 100 IU/L (ref 44–121)
BUN/Creatinine Ratio: 16 (ref 10–24)
BUN: 21 mg/dL (ref 8–27)
Bilirubin Total: 0.3 mg/dL (ref 0.0–1.2)
CO2: 27 mmol/L (ref 20–29)
Calcium: 9.7 mg/dL (ref 8.6–10.2)
Chloride: 100 mmol/L (ref 96–106)
Creatinine, Ser: 1.28 mg/dL — ABNORMAL HIGH (ref 0.76–1.27)
Globulin, Total: 2 g/dL (ref 1.5–4.5)
Glucose: 105 mg/dL — ABNORMAL HIGH (ref 70–99)
Potassium: 4.3 mmol/L (ref 3.5–5.2)
Sodium: 139 mmol/L (ref 134–144)
Total Protein: 6.7 g/dL (ref 6.0–8.5)
eGFR: 57 mL/min/{1.73_m2} — ABNORMAL LOW (ref >59)

## 2021-11-16 LAB — PRO B NATRIURETIC PEPTIDE: NT-Pro BNP: 82 pg/mL (ref 0–486)

## 2021-12-15 IMAGING — MR MR SHOULDER*R* W/O CM
4 of 5 series · 21 of 40 positions shown · non-contrast
Comparison: Right shoulder x-rays dated March 08, 2021.

CLINICAL DATA: Chronic right shoulder pain.

EXAM:
MRI OF THE RIGHT SHOULDER WITHOUT CONTRAST
TECHNIQUE: Multiplanar, multisequence MR imaging of the shoulder was performed.
No intravenous contrast was administered.

[Series 6: T2 fat-sat · axial · right · 3.0mm · 0.50mm/px · z∈[-51,+54]mm · 8 of 31 slices shown (1 of 3)]
[im 1/31]
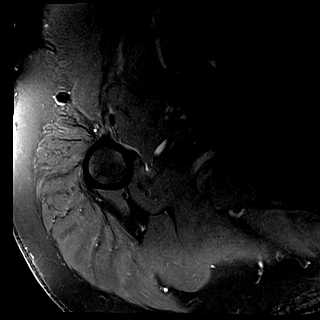
[im 4/31]
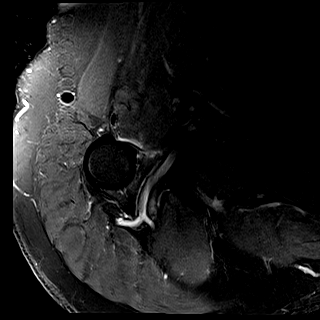
[im 11/31]
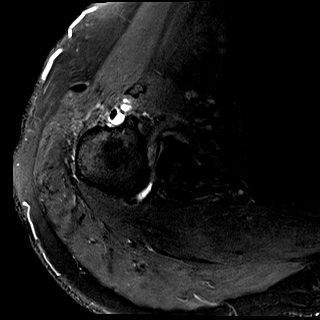
[im 14/31]
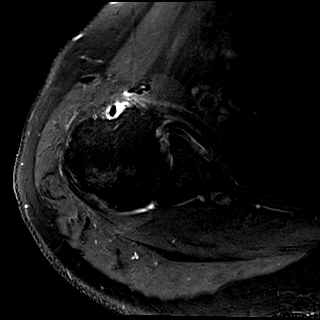
[im 17/31]
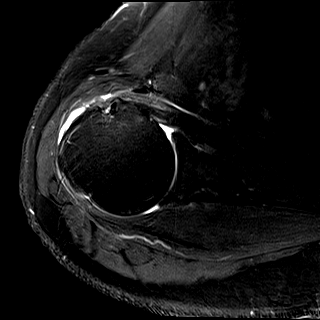
[im 21/31]
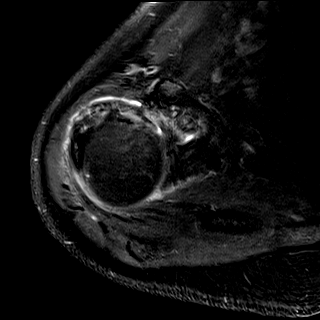
[im 27/31]
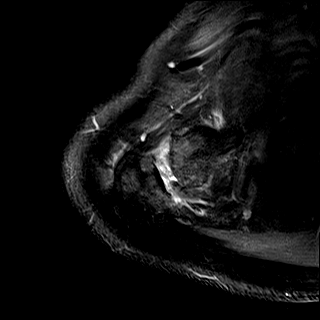
[im 31/31]
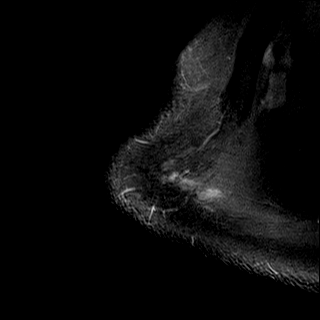

[Series 7: T2 fat-sat · oblique · right · 4.0mm · 0.23mm/px · 3 of 21 slices shown (2 of 3)]
[im 4/21]
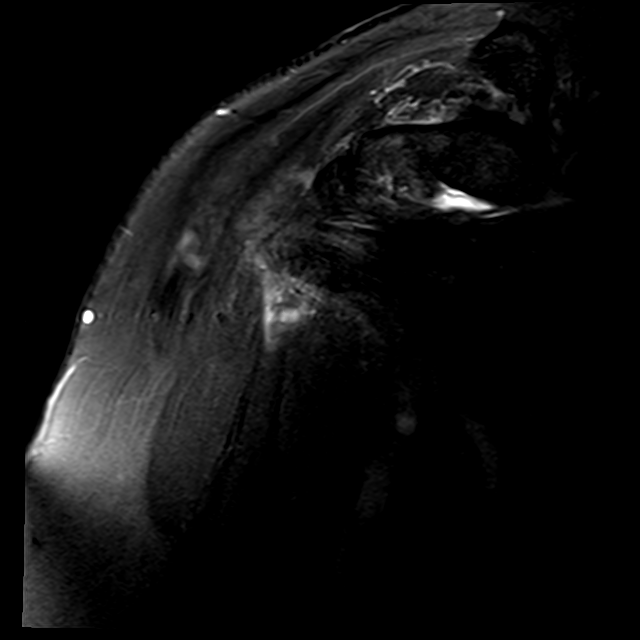
[im 11/21]
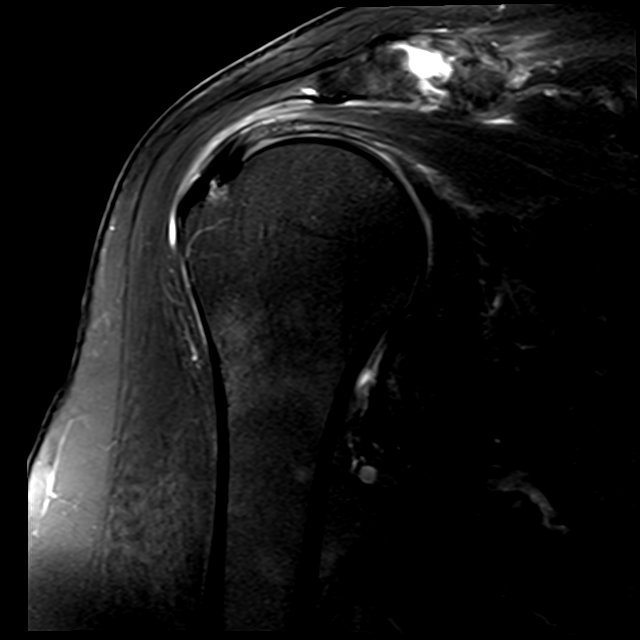
[im 17/21]
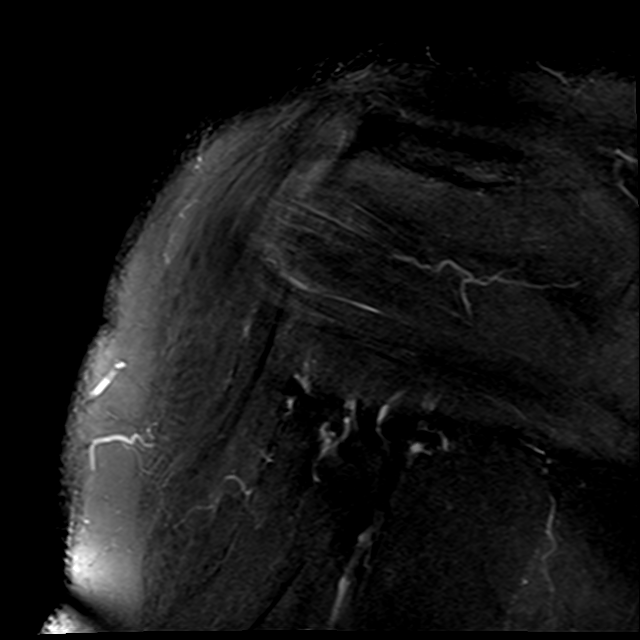

[Series 8: PD · oblique · right · 4.0mm · 0.23mm/px · 7 of 21 slices shown]
[im 1/21]
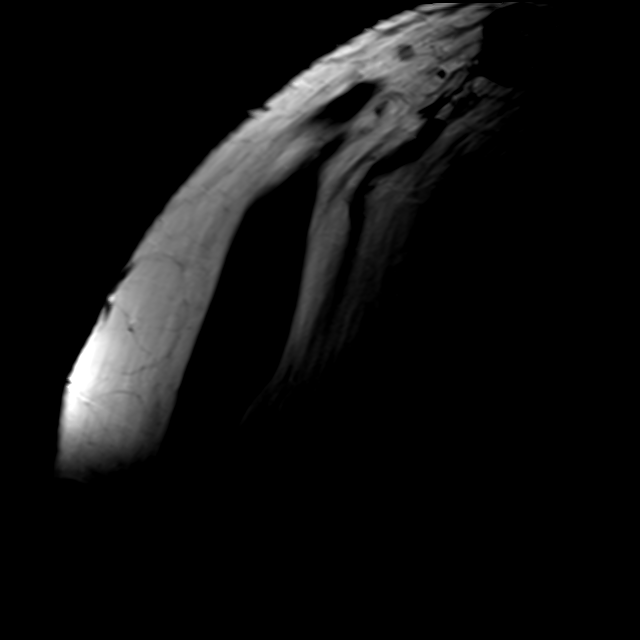
[im 4/21]
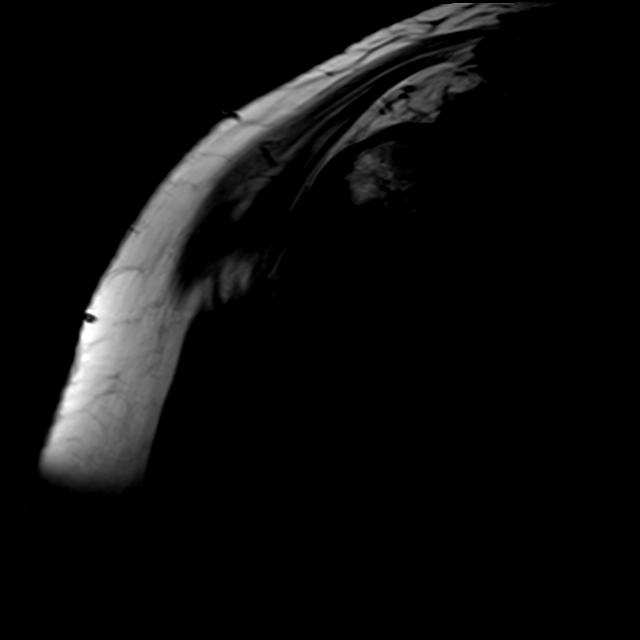
[im 7/21]
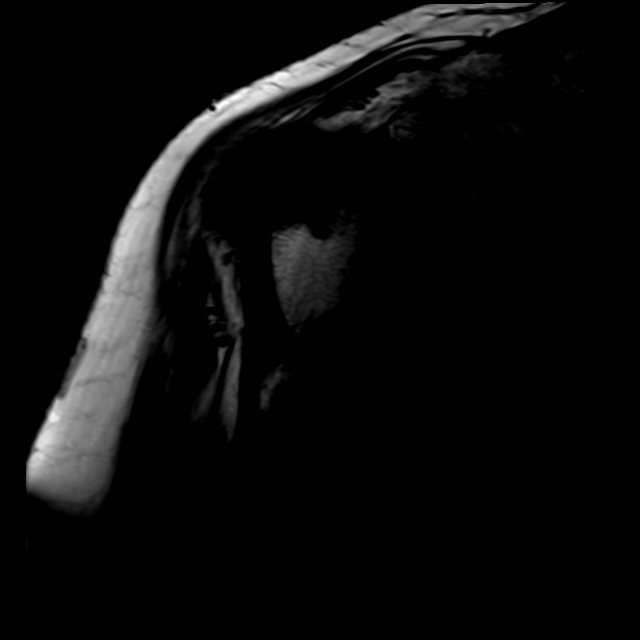
[im 11/21]
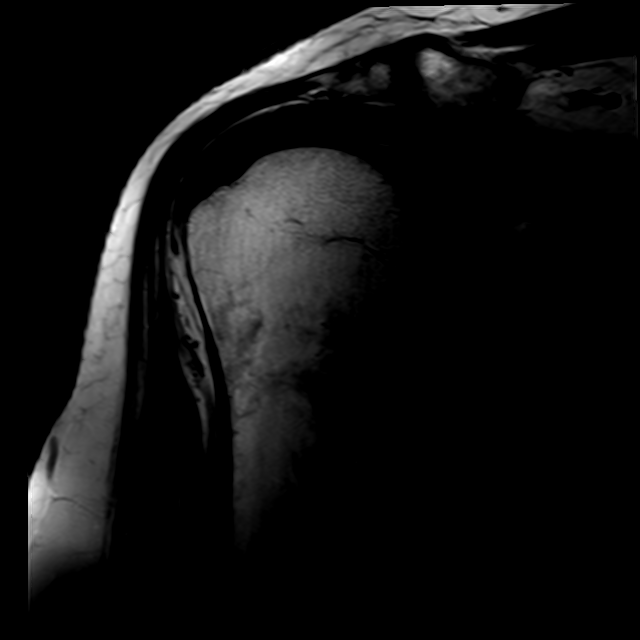
[im 14/21]
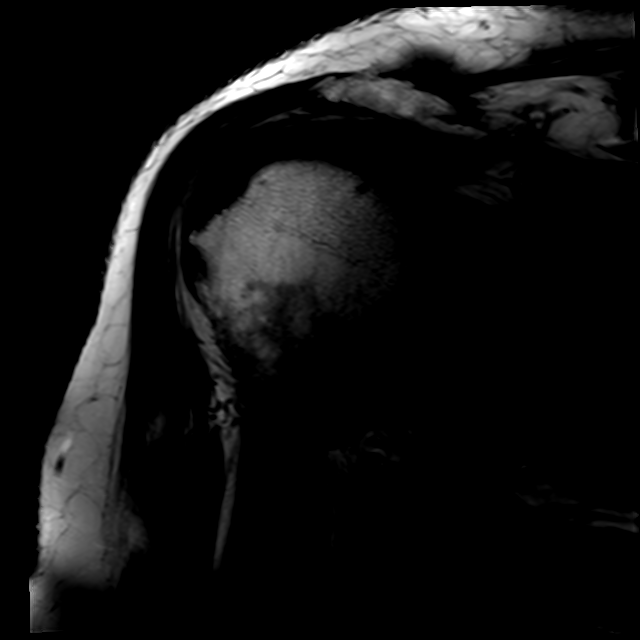
[im 17/21]
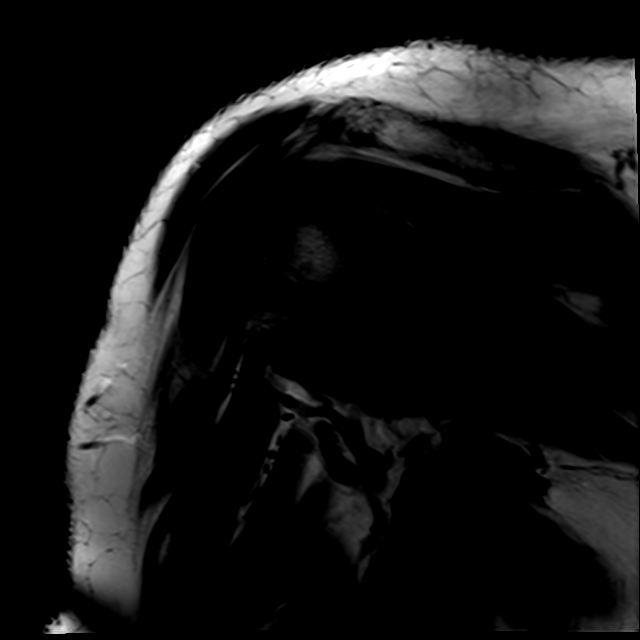
[im 21/21]
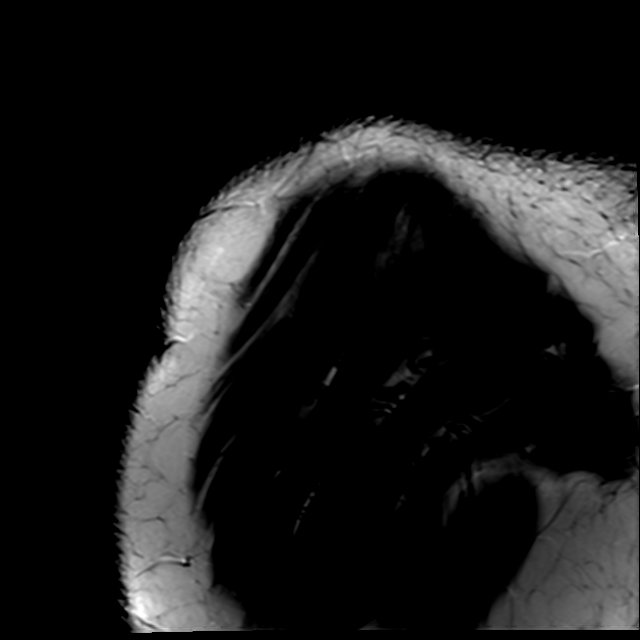

[Series 9: T2 fat-sat · oblique · right · 4.0mm · 0.44mm/px · 3 of 23 slices shown (3 of 3)]
[im 4/23]
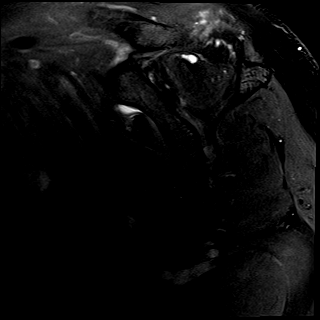
[im 13/23]
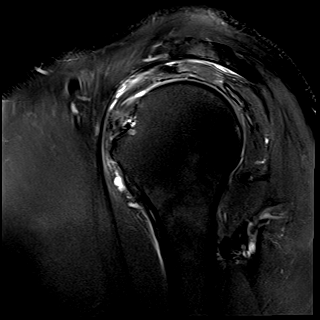
[im 19/23]
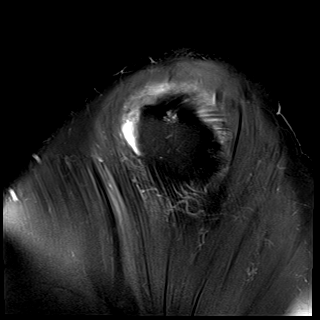

[21 of 40 positions shown; findings below may reference images not displayed]

FINDINGS: Rotator cuff: Intact rotator cuff. Moderate supraspinatus and
subscapularis tendinosis. Mild infraspinatus tendinosis.

Muscles: No atrophy or abnormal signal of the muscles of the rotator
cuff.

Biceps long head: Intact and normally positioned. Moderate
intra-articular tendinosis.

Acromioclavicular Joint: Moderate arthropathy of the
acromioclavicular joint. Type II acromion. Small amount of fluid in
the subacromial/subdeltoid bursa.

Glenohumeral Joint: No significant joint effusion. No chondral
defect.

Labrum: Degenerated but grossly intact, although evaluation is
limited by lack of intra-articular fluid.

Bones: No acute fracture or dislocation. No suspicious bone lesion.

Other: 2.8 cm lipoma between the coracoid and supraspinatus muscle.
IMPRESSION: 1. Intact rotator cuff. Moderate supraspinatus and subscapularis
tendinosis.
2. Moderate intra-articular biceps tendinosis.
3. Moderate acromioclavicular osteoarthritis.
4. Mild subacromial/subdeltoid bursitis.

## 2021-12-15 IMAGING — MR MR SHOULDER*L* W/O CM
4 of 5 series · 21 of 40 positions shown · non-contrast
Comparison: Left shoulder x-rays dated March 08, 2021.

CLINICAL DATA: Chronic left shoulder pain. No injury or prior
surgery.

EXAM:
MRI OF THE LEFT SHOULDER WITHOUT CONTRAST
TECHNIQUE: Multiplanar, multisequence MR imaging of the shoulder was performed.
No intravenous contrast was administered.

[Series 6: T2 fat-sat · axial · left · 3.0mm · 0.50mm/px · z∈[-44,+53]mm · 8 of 27 slices shown (1 of 3)]
[im 1/27]
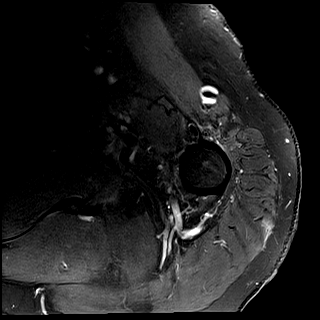
[im 3/27]
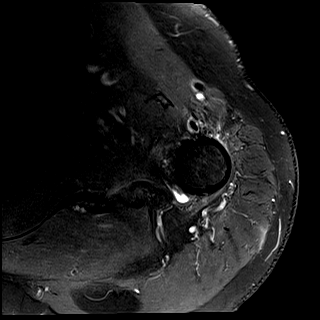
[im 9/27]
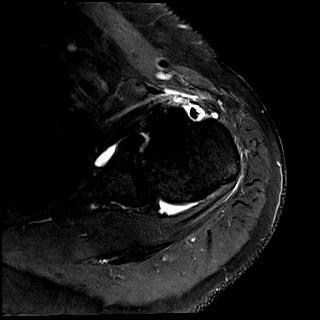
[im 12/27]
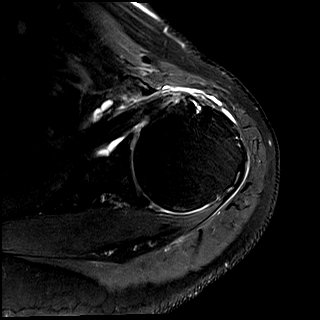
[im 15/27]
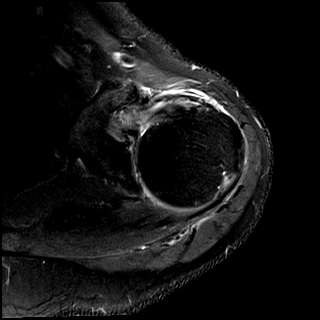
[im 18/27]
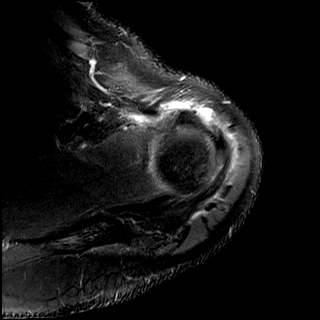
[im 24/27]
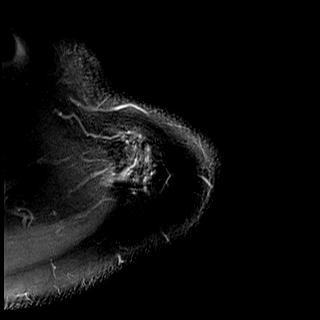
[im 27/27]
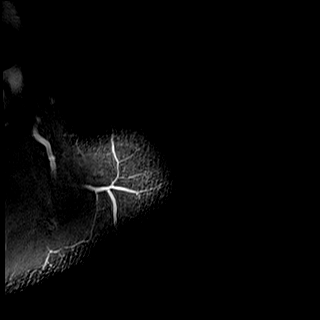

[Series 8: T2 fat-sat · oblique · left · 4.0mm · 0.47mm/px · 3 of 23 slices shown (2 of 3)]
[im 4/23]
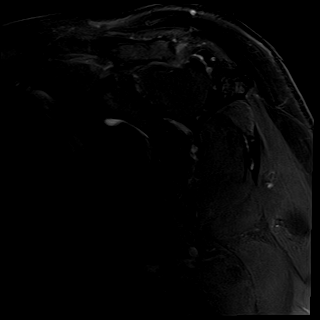
[im 13/23]
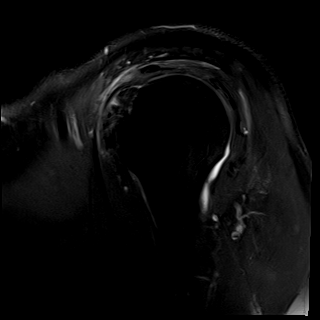
[im 19/23]
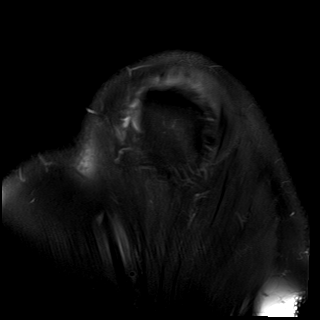

[Series 100: T2 fat-sat · oblique · left · 4.0mm · 0.23mm/px · 3 of 21 slices shown (3 of 3)]
[im 4/21]
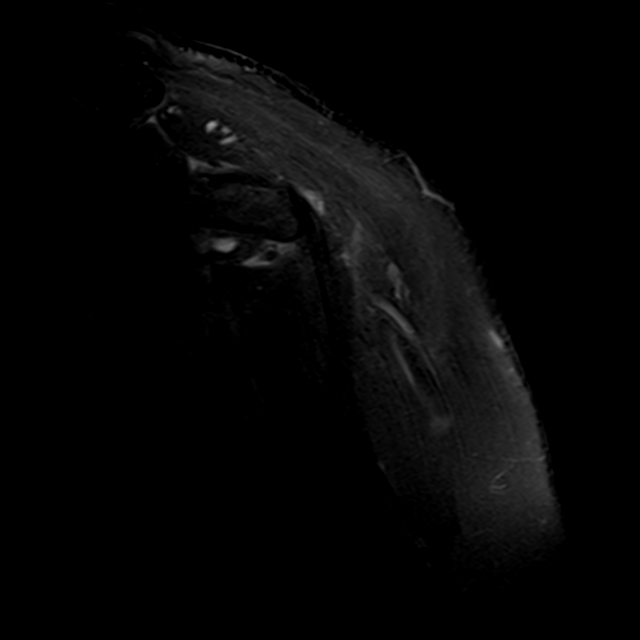
[im 11/21]
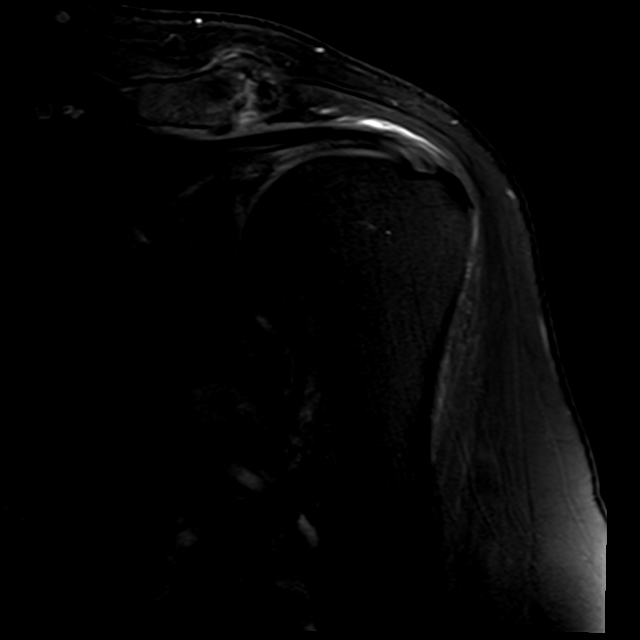
[im 17/21]
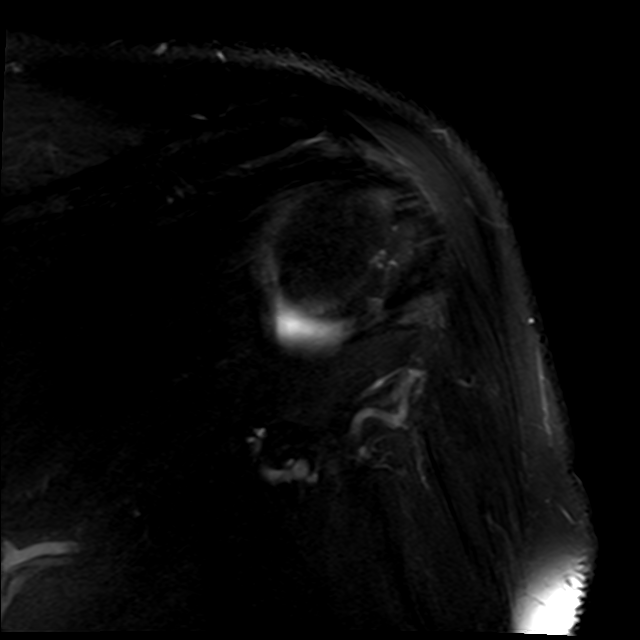

[Series 101: PD · oblique · left · 4.0mm · 0.23mm/px · 7 of 21 slices shown]
[im 1/21]
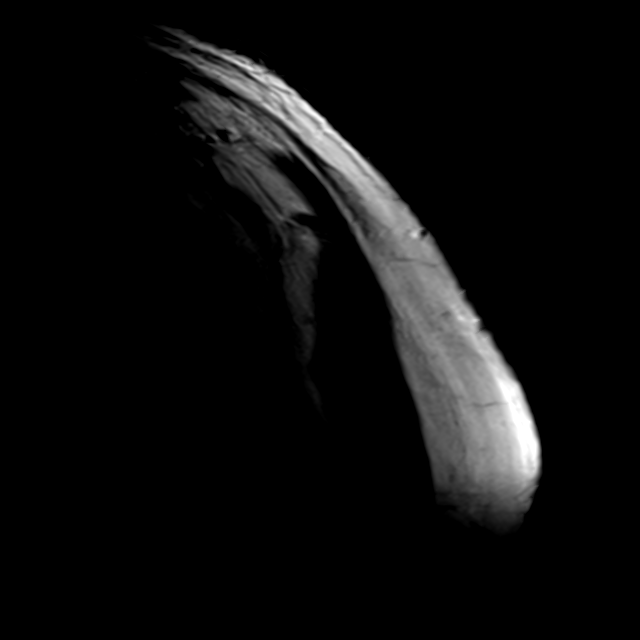
[im 4/21]
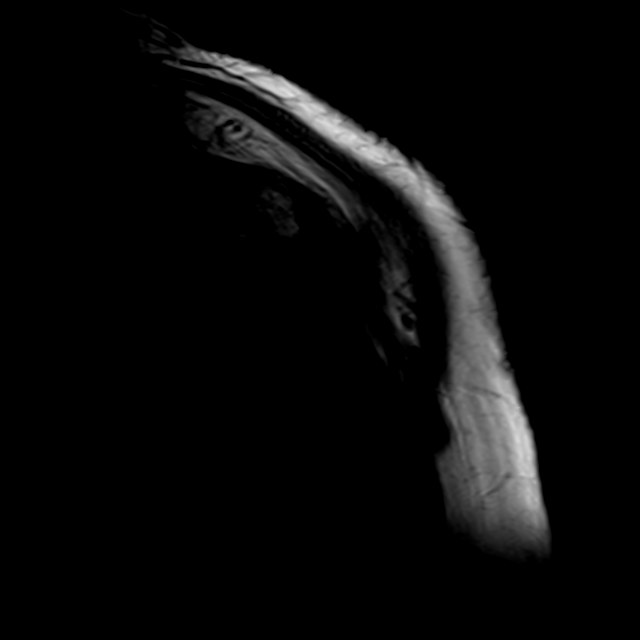
[im 7/21]
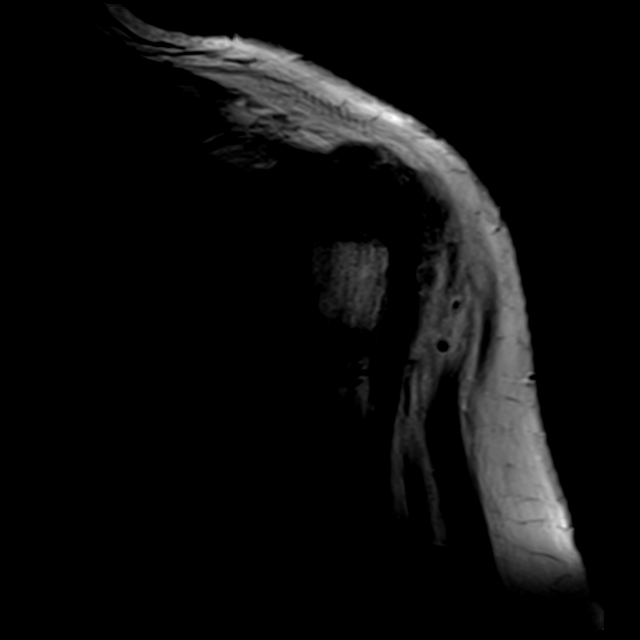
[im 11/21]
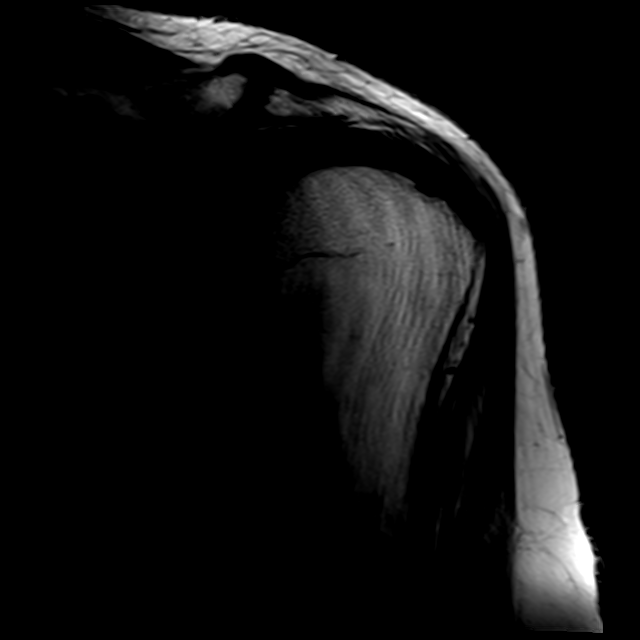
[im 14/21]
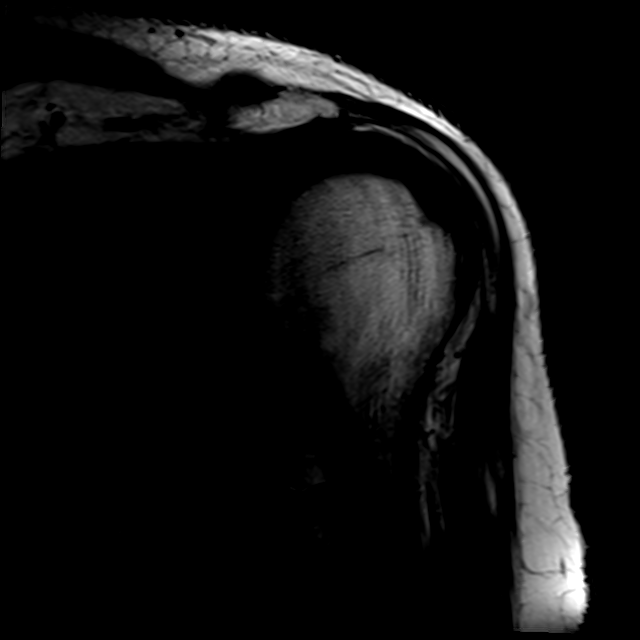
[im 17/21]
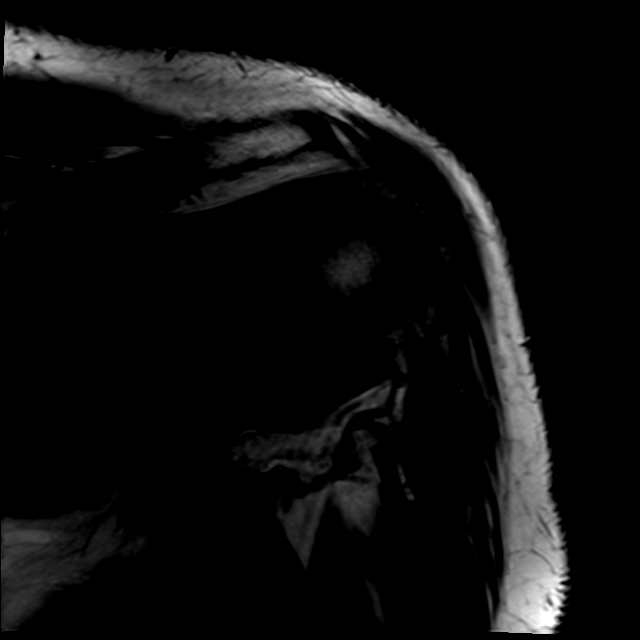
[im 21/21]
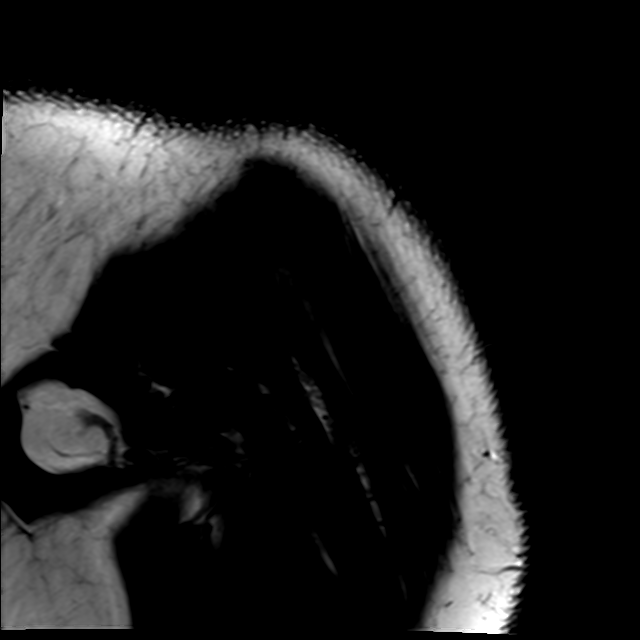

[21 of 40 positions shown; findings below may reference images not displayed]

FINDINGS: Rotator cuff: Intact rotator cuff. Mild to moderate supraspinatus
tendinosis with bursal surface fraying. Moderate subscapularis
tendinosis.

Muscles: No muscle edema or atrophy. 4.5 cm subscapularis
intramuscular lipoma. 3.5 cm posterior deltoid intramuscular lipoma.

Biceps long head: Intact and normally positioned. Moderate
tendinosis.

Acromioclavicular Joint: Moderate arthropathy of the
acromioclavicular joint. Type II acromion. Small amount of fluid in
the subacromial/subdeltoid bursa.

Glenohumeral Joint: Small joint effusion. Mild partial-thickness
cartilage loss over the humeral head.

Labrum: Degenerated but grossly intact, although evaluation is
limited by lack of intraarticular fluid.

Bones: No acute fracture or dislocation. No suspicious bone lesion.

Other: None.
IMPRESSION: 1. Intact rotator cuff. Mild to moderate supraspinatus tendinosis
with bursal surface fraying.
2. Moderate subscapularis and biceps tendinosis.
3. Moderate acromioclavicular and mild glenohumeral osteoarthritis.
4. Mild subacromial/subdeltoid bursitis.

## 2022-05-19 ENCOUNTER — Encounter: Payer: Self-pay | Admitting: Cardiology

## 2022-05-19 ENCOUNTER — Ambulatory Visit: Payer: Medicare HMO | Attending: Cardiology | Admitting: Cardiology

## 2022-05-19 VITALS — BP 144/72 | HR 66 | Ht 70.0 in | Wt 241.0 lb

## 2022-05-19 DIAGNOSIS — I110 Hypertensive heart disease with heart failure: Secondary | ICD-10-CM

## 2022-05-19 DIAGNOSIS — Z7901 Long term (current) use of anticoagulants: Secondary | ICD-10-CM | POA: Diagnosis not present

## 2022-05-19 DIAGNOSIS — I25708 Atherosclerosis of coronary artery bypass graft(s), unspecified, with other forms of angina pectoris: Secondary | ICD-10-CM | POA: Diagnosis not present

## 2022-05-19 DIAGNOSIS — I480 Paroxysmal atrial fibrillation: Secondary | ICD-10-CM | POA: Diagnosis not present

## 2022-05-19 DIAGNOSIS — Z789 Other specified health status: Secondary | ICD-10-CM

## 2022-05-19 DIAGNOSIS — E782 Mixed hyperlipidemia: Secondary | ICD-10-CM

## 2022-05-19 NOTE — Patient Instructions (Signed)
Medication Instructions:  Your physician recommends that you continue on your current medications as directed. Please refer to the Current Medication list given to you today.  *If you need a refill on your cardiac medications before your next appointment, please call your pharmacy*   Lab Work: None If you have labs (blood work) drawn today and your tests are completely normal, you will receive your results only by: MyChart Message (if you have MyChart) OR A paper copy in the mail If you have any lab test that is abnormal or we need to change your treatment, we will call you to review the results.   Testing/Procedures: Your physician has requested that you have an echocardiogram. Echocardiography is a painless test that uses sound waves to create images of your heart. It provides your doctor with information about the size and shape of your heart and how well your heart's chambers and valves are working. This procedure takes approximately one hour. There are no restrictions for this procedure.    Follow-Up: At [facility], you and your health needs are our priority.  As part of our continuing mission to provide you with exceptional heart care, we have created designated Provider Care Teams.  These Care Teams include your primary Cardiologist (physician) and Advanced Practice Providers (APPs -  Physician Assistants and Nurse Practitioners) who all work together to provide you with the care you need, when you need it.  We recommend signing up for the patient portal called "MyChart".  Sign up information is provided on this After Visit Summary.  MyChart is used to connect with patients for Virtual Visits (Telemedicine).  Patients are able to view lab/test results, encounter notes, upcoming appointments, etc.  Non-urgent messages can be sent to your provider as well.   To learn more about what you can do with MyChart, go to [email or web address].    Your next appointment:   6  month(s)  The format for your next appointment:   In Person  Provider:   Brian Munley, MD    Other Instructions None  Important Information About Sugar       

## 2022-05-19 NOTE — Progress Notes (Signed)
Cardiology Office Note:    Date:  05/19/2022   ID:  Jimmy Chapman, DOB 12/29/1940, MRN [id number]  PCP:  Gordan Payment., MD  Cardiologist:  Norman Herrlich, MD    Referring MD: Gordan Payment., MD    ASSESSMENT:    1. Paroxysmal atrial fibrillation (HCC)   2. Chronic anticoagulation   3. Hypertensive heart disease with heart failure (HCC)   4. Coronary artery disease of bypass graft of native heart with stable angina pectoris (HCC)   5. Mixed hyperlipidemia   6. Statin intolerance    PLAN:    In order of problems listed above:  From a cardiology perspective doing well no clinical recurrence of atrial fibrillation he will continue his beta-blocker and combined anticoagulant antiplatelet with coincident CAD Stable continue his high-dose loop diuretic we will recheck echocardiogram to see if we need to consider transition from [facility] to [facility].  Continue carvedilol and MRA along with his loop diuretic Stable CAD having no anginal discomfort continue his medical therapy including oral nitrate and beta-blocker this been quite effective for his chronic CAD Statin intolerant he had myopathy currently not on lipid-lowering therapy Offered to do labs today he declined and said he also done at the [facility]   Next appointment: 6 months   Medication Adjustments/Labs and Tests Ordered: Current medicines are reviewed at length with the patient today.  Concerns regarding medicines are outlined above.  Orders Placed This Encounter  Procedures   ECHOCARDIOGRAM COMPLETE   No orders of the defined types were placed in this encounter.  Chief complaint follow-up for heart disease and feeling better   History of Present Illness:    Jimmy Chapman is a 81 y.o. male with a hx of CAD paroxysmal atrial fibrillation chronic anticoagulation hyperlipidemia hypertensive heart disease with heart failure diastolic stroke January 2021 occipital with visual loss and myopathy related to statin.  Left heart  catheterization 04/24/2019 showed EF of 60% with moderate stenosis of the right coronary artery and distal left anterior descending arteries treated medically.  He is alsointolerant of both statin and bempedoic acid and was referred to lipid clinic for PCSK9 therapy. He was last seen 11/15/2021.  Compliance with diet, lifestyle and medications: Yes he is now on medications for gout markedly improved?  Colchicine?  Allopurinol  He is getting primary care through the [place] [facility] he is having labs done in the next week he will bring a copy to me  Overall he is feeling much better less joint pain less muscle weakness but still has exertional shortness of breath he Has had no angina has not needed nitroglycerin no orthopnea palpitation or syncope He tolerates his combined anticoagulant clopidogrel without bleeding. Past Medical History:  Diagnosis Date   Acute cardioembolic stroke (HCC) 01/14/2020   Acute respiratory failure (HCC) 01/14/2020   Anemia, chronic disease 10/03/2016   Angina pectoris (HCC) 02/04/2015   Aortic calcification (HCC) 10/11/2015   Atherosclerosis of both carotid arteries 10/13/2019   Formatting of this note might be different from the original. 09/2019   Atherosclerotic heart disease of native coronary artery without angina pectoris 02/03/2015   Formatting of this note might be different from the original. 1. Multivessel PCI in Nov 2014 with 3 Xience stents to proximal LAD, LCF and RCA, EF 45% 2. 02/03/15 with PCI and resolute stennt LCF and PTCA of ostial RCA stenosis, EF 60% for unstable angina Formatting of this note might be different from the original. Last cath 04/2019. Hx PCI.  70% lesions. Medical management.   Benign essential HTN 02/03/2015   Benign prostatic hyperplasia with urinary hesitancy 08/17/2016   BPH (benign prostatic hyperplasia) 08/17/2016   CAD (coronary artery disease) 10/11/2015   1. Multivessel PCI in Nov 2014 with 3 Xience stents to proximal LAD, LCF and RCA,  EF 45% 2. 02/03/15 with PCI and resolute stennt LCF and PTCA of ostial RCA stenosis, EF 60% for unstable angina   Cerebral infarction (HCC) 03/25/2020   Chest pain at rest 02/04/2015   Chronic diastolic heart failure (HCC) 04/16/2018   Chronic low back pain 03/25/2020   Chronic rhinitis 03/25/2020   Class 2 severe obesity due to excess calories with serious comorbidity and body mass index (BMI) of 36.0 to 36.9 in adult [facility]) 10/18/2015   COPD, mild (HCC) 10/13/2019   Coronary artery disease of bypass graft of native heart with stable angina pectoris (HCC) 04/28/2019   Coronary disease 10/11/2015   1. Multivessel PCI in Nov 2014 with 3 Xience stents to proximal LAD, LCF and RCA, EF 45% 2. 02/03/15 with PCI and resolute stennt LCF and PTCA of ostial RCA stenosis, EF 60% for unstable angina (b.) 04/25/19 cath with mod RCA and mod LAD disease recommended for med therapy with normal EF   Degeneration of lumbar intervertebral disc 08/17/2016   Degenerative lumbar disc 08/17/2016   Dizziness of unknown etiology 04/26/2020   Essential hypertension 10/11/2015   Generalized osteoarthritis 03/25/2020   High risk medication use 10/15/2015   Hyperlipidemia 02/03/2015   Hypertensive heart disease with heart failure (HCC) 02/03/2015   Hypoxia 10/11/2015   LAE (left atrial enlargement) 10/11/2015   Left ventricular hypertrophy 10/11/2015   Localized edema 11/14/2016   Lumbar radiculopathy 01/14/2020   Malaise and fatigue 10/11/2015   Nausea & vomiting 01/14/2020   Obesity 01/14/2020   Obesity (BMI 30.0-34.9) 10/18/2015   Obstructive sleep apnea syndrome 04/10/2018   Wears CPAP   Oliguria 01/14/2020   Osteoarthritis 10/11/2015   Other ill-defined and unknown causes of morbidity and mortality 10/18/2015   Other specified abnormal findings of blood chemistry 01/14/2020   Paroxysmal atrial fibrillation (HCC) 03/02/2015   Brief during cardiac cath, not anticoagulated   Pneumonia 01/14/2020   Prediabetes 10/11/2015   Presence of intraocular lens  03/25/2020   Primary osteoarthritis, right ankle and foot 03/25/2020   Psychosexual dysfunction with inhibited sexual excitement 04/26/2020   Pulmonary fibrosis (HCC) 10/11/2015   Radicular pain of both lower extremities 01/14/2020   Rotaviral gastroenteritis 01/14/2020   S/P lumbar spinal fusion 01/14/2020   Screening for prostate cancer 04/10/2018   Chooses no further testing.   Seasonal allergic rhinitis due to pollen 01/23/2020   Sinus bradycardia 04/28/2019   Status post percutaneous transluminal coronary angioplasty 04/26/2020   Status post stroke 10/13/2019   Formatting of this note might be different from the original. 10/10/2019   Stroke [facility])    TIA (transient ischemic attack) 01/14/2020   Vitamin B12 deficiency 10/03/2016   Wound infection after surgery 01/14/2020    Past Surgical History:  Procedure Laterality Date   APPENDECTOMY     CATARACT EXTRACTION, BILATERAL     CORONARY ANGIOPLASTY WITH STENT PLACEMENT     HERNIA REPAIR      Current Medications: Current Meds  Medication Sig   acetaminophen (TYLENOL) 325 MG tablet Take 650 mg by mouth every 6 (six) hours as needed for moderate pain.   albuterol (VENTOLIN HFA) 108 (90 Base) MCG/ACT inhaler Inhale 2 puffs into the lungs every 6 (  six) hours as needed for shortness of breath.   apixaban (ELIQUIS) 5 MG TABS tablet Take 5 mg by mouth in the morning and at bedtime.   Ascorbic Acid (VITAMIN C) 1000 MG tablet Take 500 mg by mouth 2 (two) times daily.   Carboxymethylcellulose Sod PF 0.5 % SOLN Administer 2 drops to both eyes daily at 0600.   carvedilol (COREG) 12.5 MG tablet Take 0.5 tablets by mouth 2 (two) times daily.   cholecalciferol (VITAMIN D3) 25 MCG (1000 UNIT) tablet Take 1,000 Units by mouth daily.   clopidogrel (PLAVIX) 75 MG tablet Take 75 mg by mouth daily.   cyanocobalamin 1000 MCG tablet Take 1,000 mcg by mouth daily.   finasteride (PROSCAR) 5 MG tablet Take 5 mg by mouth daily.   fluticasone (FLONASE) 50 MCG/ACT nasal  spray 2 sprays by Each Nare route Two (2) times a day.   furosemide (LASIX) 80 MG tablet Take 80 mg by mouth 2 (two) times daily.   metFORMIN (GLUCOPHAGE) 500 MG tablet Take 250 mg by mouth 2 (two) times daily.   Mometasone Furoate (ASMANEX HFA IN) Inhale 2 puffs into the lungs 2 (two) times daily.   nitroGLYCERIN (NITROSTAT) 0.4 MG SL tablet Place 1 tablet (0.4 mg total) under the tongue every 5 (five) minutes as needed for chest pain.   pantoprazole (PROTONIX) 40 MG tablet Take 40 mg by mouth daily.   Probiotic Product (PROBIOTIC DAILY PO) Take 1 capsule by mouth daily.   spironolactone (ALDACTONE) 25 MG tablet Take 12.5 mg by mouth daily. 1/2 TAB   telmisartan (MICARDIS) 20 MG tablet Take 20 mg by mouth. Take 1/2 tablet (10 mg) 2 times daily   traMADol (ULTRAM) 50 MG tablet Take 50-100 mg by mouth every 6 (six) hours as needed.     Allergies:   Alogliptin, Atorvastatin, Gemfibrozil, Niacin, Pravastatin, and Simvastatin   Social History   Socioeconomic History   Marital status: Married    Spouse name: Not on file   Number of children: Not on file   Years of education: Not on file   Highest education level: Not on file  Occupational History   Not on file  Tobacco Use   Smoking status: Former    Types: Cigarettes    Passive exposure: Past   Smokeless tobacco: Former    Types: Associate Professor Use: Never used  Substance and Sexual Activity   Alcohol use: Yes    Comment: occ   Drug use: Not Currently   Sexual activity: Not on file  Other Topics Concern   Not on file  Social History Narrative   Not on file   Social Determinants of Health   Financial Resource Strain: Not on file  Food Insecurity: Not on file  Transportation Needs: Not on file  Physical Activity: Not on file  Stress: Not on file  Social Connections: Not on file     Family History: The patient's family history includes Heart attack in his father; Hypertension in his mother. ROS:   Please see  the history of present illness.    All other systems reviewed and are negative.  EKGs/Labs/Other Studies Reviewed:    The following studies were reviewed today:   Recent Labs: 11/15/2021: ALT 16; BUN 21; Creatinine, Ser 1.28; NT-Pro BNP 82; Potassium 4.3; Sodium 139  Recent Lipid Panel    Component Value Date/Time   CHOL 134 11/15/2021 1415   TRIG 467 (H) 11/15/2021 1415   HDL 32 (  L) 11/15/2021 1415   CHOLHDL 4.2 11/15/2021 1415   LDLCALC 36 11/15/2021 1415    Physical Exam:    VS:  BP (!) 144/72 (BP Location: Left Arm, Patient Position: Sitting)   Pulse 66   Ht 5\' 10"  (1.778 m)   Wt 241 lb (109.3 kg)   SpO2 96%   BMI 34.58 kg/m     Wt Readings from Last 3 Encounters:  05/19/22 241 lb (109.3 kg)  11/15/21 243 lb (110.2 kg)  05/18/21 250 lb (113.4 kg)     GEN: He looks much stronger well nourished, well developed in no acute distress HEENT: Normal NECK: No JVD; No carotid bruits LYMPHATICS: No lymphadenopathy CARDIAC: RRR, no murmurs, rubs, gallops RESPIRATORY:  Clear to auscultation without rales, wheezing or rhonchi  ABDOMEN: Soft, non-tender, non-distended MUSCULOSKELETAL:  No edema; No deformity  SKIN: Warm and dry NEUROLOGIC:  Alert and oriented x 3 PSYCHIATRIC:  Normal affect    Signed, 07/18/21, MD  05/19/2022 2:26 PM    [facility]

## 2022-05-31 ENCOUNTER — Ambulatory Visit: Payer: Medicare HMO | Attending: Cardiology

## 2022-05-31 DIAGNOSIS — Z7901 Long term (current) use of anticoagulants: Secondary | ICD-10-CM

## 2022-05-31 DIAGNOSIS — E782 Mixed hyperlipidemia: Secondary | ICD-10-CM

## 2022-05-31 DIAGNOSIS — I25708 Atherosclerosis of coronary artery bypass graft(s), unspecified, with other forms of angina pectoris: Secondary | ICD-10-CM

## 2022-05-31 DIAGNOSIS — I110 Hypertensive heart disease with heart failure: Secondary | ICD-10-CM

## 2022-05-31 DIAGNOSIS — I480 Paroxysmal atrial fibrillation: Secondary | ICD-10-CM

## 2022-05-31 DIAGNOSIS — Z789 Other specified health status: Secondary | ICD-10-CM

## 2022-05-31 LAB — ECHOCARDIOGRAM COMPLETE
Area-P 1/2: 2.95 cm2
S' Lateral: 2.9 cm

## 2022-11-17 ENCOUNTER — Ambulatory Visit: Payer: Medicare HMO | Admitting: Cardiology
